# Patient Record
Sex: Female | Born: 1960 | Race: Black or African American | Hispanic: No | Marital: Single | Smoking: Never smoker
Health system: Southern US, Community
[De-identification: ages and names within clinical notes are randomized; demographics above are authoritative.]

## PROBLEM LIST (undated history)

## (undated) DIAGNOSIS — F4310 Post-traumatic stress disorder, unspecified: Secondary | ICD-10-CM

## (undated) DIAGNOSIS — G4730 Sleep apnea, unspecified: Secondary | ICD-10-CM

## (undated) DIAGNOSIS — A159 Respiratory tuberculosis unspecified: Secondary | ICD-10-CM

## (undated) DIAGNOSIS — N809 Endometriosis, unspecified: Secondary | ICD-10-CM

## (undated) DIAGNOSIS — I10 Essential (primary) hypertension: Secondary | ICD-10-CM

## (undated) DIAGNOSIS — R011 Cardiac murmur, unspecified: Secondary | ICD-10-CM

## (undated) DIAGNOSIS — R7303 Prediabetes: Secondary | ICD-10-CM

## (undated) DIAGNOSIS — N83209 Unspecified ovarian cyst, unspecified side: Secondary | ICD-10-CM

## (undated) DIAGNOSIS — R519 Headache, unspecified: Secondary | ICD-10-CM

## (undated) DIAGNOSIS — M1710 Unilateral primary osteoarthritis, unspecified knee: Secondary | ICD-10-CM

## (undated) DIAGNOSIS — D219 Benign neoplasm of connective and other soft tissue, unspecified: Secondary | ICD-10-CM

## (undated) HISTORY — PX: WISDOM TOOTH EXTRACTION: SHX21

## (undated) HISTORY — PX: DILATION AND CURETTAGE OF UTERUS: SHX78

---

## 2018-04-08 ENCOUNTER — Other Ambulatory Visit: Payer: Self-pay

## 2018-04-08 ENCOUNTER — Encounter (HOSPITAL_COMMUNITY): Payer: Self-pay | Admitting: Emergency Medicine

## 2018-04-08 ENCOUNTER — Ambulatory Visit (HOSPITAL_COMMUNITY)
Admission: EM | Admit: 2018-04-08 | Discharge: 2018-04-08 | Disposition: A | Discharge status: Home / Self Care | Payer: 59 | Attending: Emergency Medicine | Admitting: Emergency Medicine

## 2018-04-08 ENCOUNTER — Ambulatory Visit (INDEPENDENT_AMBULATORY_CARE_PROVIDER_SITE_OTHER): Payer: 59

## 2018-04-08 DIAGNOSIS — R05 Cough: Secondary | ICD-10-CM | POA: Diagnosis not present

## 2018-04-08 DIAGNOSIS — R69 Illness, unspecified: Secondary | ICD-10-CM | POA: Diagnosis not present

## 2018-04-08 DIAGNOSIS — R062 Wheezing: Secondary | ICD-10-CM

## 2018-04-08 DIAGNOSIS — M7918 Myalgia, other site: Secondary | ICD-10-CM

## 2018-04-08 DIAGNOSIS — R112 Nausea with vomiting, unspecified: Secondary | ICD-10-CM | POA: Diagnosis not present

## 2018-04-08 DIAGNOSIS — J111 Influenza due to unidentified influenza virus with other respiratory manifestations: Secondary | ICD-10-CM

## 2018-04-08 DIAGNOSIS — J029 Acute pharyngitis, unspecified: Secondary | ICD-10-CM

## 2018-04-08 HISTORY — DX: Endometriosis, unspecified: N80.9

## 2018-04-08 HISTORY — DX: Unilateral primary osteoarthritis, unspecified knee: M17.10

## 2018-04-08 HISTORY — DX: Post-traumatic stress disorder, unspecified: F43.10

## 2018-04-08 HISTORY — DX: Benign neoplasm of connective and other soft tissue, unspecified: D21.9

## 2018-04-08 HISTORY — DX: Unspecified ovarian cyst, unspecified side: N83.209

## 2018-04-08 LAB — POCT RAPID STREP A: STREPTOCOCCUS, GROUP A SCREEN (DIRECT): NEGATIVE

## 2018-04-08 MED ORDER — ACETAMINOPHEN 325 MG PO TABS
650.0000 mg | ORAL_TABLET | Freq: Once | ORAL | Status: AC
  Administered 2018-04-08: 650 mg via ORAL

## 2018-04-08 MED ORDER — ONDANSETRON 4 MG PO TBDP: 4.0000 mg | ORAL_TABLET | Freq: Three times a day (TID) | ORAL | 0 refills | Status: DC | PRN

## 2018-04-08 MED ORDER — IPRATROPIUM-ALBUTEROL 0.5-2.5 (3) MG/3ML IN SOLN
3.0000 mL | Freq: Once | RESPIRATORY_TRACT | Status: AC
  Administered 2018-04-08: 3 mL via RESPIRATORY_TRACT

## 2018-04-08 MED ORDER — IBUPROFEN 600 MG PO TABS: 600.0000 mg | ORAL_TABLET | Freq: Four times a day (QID) | ORAL | 0 refills | Status: DC | PRN

## 2018-04-08 MED ORDER — CETIRIZINE HCL 10 MG PO CAPS: 10.0000 mg | ORAL_CAPSULE | Freq: Every day | ORAL | 0 refills | Status: DC

## 2018-04-08 MED ORDER — BENZONATATE 200 MG PO CAPS: 200.0000 mg | ORAL_CAPSULE | Freq: Three times a day (TID) | ORAL | 0 refills | Status: AC | PRN

## 2018-04-08 MED ORDER — OSELTAMIVIR PHOSPHATE 75 MG PO CAPS: 75.0000 mg | ORAL_CAPSULE | Freq: Two times a day (BID) | ORAL | 0 refills | Status: AC

## 2018-04-08 MED ORDER — ACETAMINOPHEN 325 MG PO TABS
ORAL_TABLET | ORAL | Status: AC
  Filled 2018-04-08: qty 2

## 2018-04-08 MED ORDER — ALBUTEROL SULFATE HFA 108 (90 BASE) MCG/ACT IN AERS: 1.0000 | INHALATION_SPRAY | Freq: Four times a day (QID) | RESPIRATORY_TRACT | 0 refills | Status: AC | PRN

## 2018-04-08 MED ORDER — IPRATROPIUM-ALBUTEROL 0.5-2.5 (3) MG/3ML IN SOLN
RESPIRATORY_TRACT | Status: AC
  Filled 2018-04-08: qty 3

## 2018-04-08 NOTE — Discharge Instructions (Signed)
Strep test and xray negative for pneumonia You likely have the flu or a similar viral upper respiratory infection Tamiflu twice daily for 5 days Begin daily cetirizine to help with congestion and drainage Tessalon every 8 hours as needed for cough Alternate tylenol and ibuprofen for better control of fever, body aches, headache.  Push fluids  Please follow up if fever not breaking, developing increased shortness of breath, difficulty breathing

## 2018-04-08 NOTE — ED Triage Notes (Signed)
Pt here for body aches and a cough that started on Sunday.  Pt states she was being treated for an inner ear infection with meclizine and antibiotics, along with Zofran for nausea.  Pt states she has been taking them as prescribed.

## 2018-04-09 NOTE — ED Provider Notes (Signed)
[place]    CSN: [id number] Arrival date & time: 04/08/18  1048     History   Chief Complaint Chief Complaint  Patient presents with  . Generalized Body Aches  . Cough    HPI Teresa Mann is a 58 y.o. female no contributing past medical history presenting today for evaluation of cough, body aches, hot and cold chills, sore throat.  Symptoms began on Sunday, 2 days ago.  She was seen approximately 1 week ago elsewhere and was treated for an inner ear infection.  She is taking amoxicillin.  She is also been using Zofran and meclizine as needed for nausea and vomiting.  On Sunday she had worsening of her symptoms.  She has tried Coricidin for nasal congestion without relief.  She has had some slight shortness of breath.  She denies any diarrhea or changes in bowel movements.  Denies abdominal pain.  Patient works for the [place], travels from [place] where she lives to work here.  HPI  Past Medical History:  Diagnosis Date  . Arthritis of knee   . Endometriosis   . Fibroids   . Ovarian cyst   . PTSD (post-traumatic stress disorder)     There are no active problems to display for this patient.   History reviewed. No pertinent surgical history.  OB History   No obstetric history on file.      Home Medications    Prior to Admission medications   Medication Sig Start Date End Date Taking? Authorizing Provider  meclizine (ANTIVERT) 25 MG tablet  02/03/18  Yes [provider]  albuterol (PROVENTIL HFA;VENTOLIN HFA) 108 (90 Base) MCG/ACT inhaler Inhale 1-2 puffs into the lungs every 6 (six) hours as needed for wheezing or shortness of breath. 04/08/18   Sharnetta Gielow C, PA-C  benzonatate (TESSALON) 200 MG capsule Take 1 capsule (200 mg total) by mouth 3 (three) times daily as needed for up to 7 days for cough. 04/08/18 04/15/18  Shaneya Taketa C, PA-C  Cetirizine HCl 10 MG CAPS Take 1 capsule (10 mg total) by mouth daily for 10 days. 04/08/18 04/18/18   Caley Ciaramitaro C, PA-C  ibuprofen (ADVIL,MOTRIN) 600 MG tablet Take 1 tablet (600 mg total) by mouth every 6 (six) hours as needed. 04/08/18   Selassie Spatafore C, PA-C  ondansetron (ZOFRAN ODT) 4 MG disintegrating tablet Take 1 tablet (4 mg total) by mouth every 8 (eight) hours as needed for nausea or vomiting. 04/08/18   Caidon Foti C, PA-C  oseltamivir (TAMIFLU) 75 MG capsule Take 1 capsule (75 mg total) by mouth every 12 (twelve) hours for 5 days. 04/08/18 04/13/18  Chestine Belknap, Elesa Hacker, PA-C    Family History History reviewed. No pertinent family history.  Social History Social History   Tobacco Use  . Smoking status: Never Smoker  . Smokeless tobacco: Never Used  Substance Use Topics  . Alcohol use: Never    Frequency: Never  . Drug use: Never     Allergies   Patient has no known allergies.   Review of Systems Review of Systems  Constitutional: Positive for chills, fatigue and fever. Negative for activity change and appetite change.  HENT: Positive for congestion, rhinorrhea and sore throat. Negative for ear pain, sinus pressure and trouble swallowing.   Eyes: Negative for discharge and redness.  Respiratory: Positive for cough. Negative for chest tightness and shortness of breath.   Cardiovascular: Negative for chest pain.  Gastrointestinal: Positive for nausea. Negative for abdominal pain, diarrhea and  vomiting.  Musculoskeletal: Positive for myalgias.  Skin: Negative for rash.  Neurological: Positive for headaches. Negative for dizziness and light-headedness.     Physical Exam Triage Vital Signs ED Triage Vitals  Enc Vitals Group     BP 04/08/18 1155 (!) 142/89     Pulse Rate 04/08/18 1155 (!) 122     Resp 04/08/18 1155 (!) 32     Temp 04/08/18 1155 (!) 104.2 F (40.1 C)     Temp Source 04/08/18 1155 Temporal     SpO2 04/08/18 1155 96 %     Weight --      Height --      Head Circumference --      Peak Flow --      Pain Score 04/08/18 1158 9     Pain Loc --        Pain Edu? --      Excl. in [place]? --    No data found.  Updated Vital Signs BP (!) 142/89 (BP Location: Left Arm)   Pulse (!) 122   Temp (!) 104.2 F (40.1 C) (Temporal)   Resp (!) 32   SpO2 96%   Visual Acuity Right Eye Distance:   Left Eye Distance:   Bilateral Distance:    Right Eye Near:   Left Eye Near:    Bilateral Near:     Physical Exam Vitals signs and nursing note reviewed.  Constitutional:      General: She is not in acute distress.    Appearance: She is well-developed.     Comments: Appears slightly uncomfortable, but nontoxic appearing, no acute distress  HENT:     Head: Normocephalic and atraumatic.     Ears:     Comments: Bilateral ears without tenderness to palpation of external auricle, tragus and mastoid, EAC's without erythema or swelling, TM's with good bony landmarks and cone of light.  Left TM slightly erythematous to superior aspect of TM, right TM nonerythematous    Nose:     Comments: Rhinorrhea present bilaterally    Mouth/Throat:     Comments: Oral mucosa pink and moist, no tonsillar enlargement or exudate. Posterior pharynx patent and nonerythematous, no uvula deviation or swelling. Normal phonation. Eyes:     Conjunctiva/sclera: Conjunctivae normal.  Neck:     Musculoskeletal: Neck supple.  Cardiovascular:     Rate and Rhythm: Regular rhythm. Tachycardia present.     Heart sounds: No murmur.  Pulmonary:     Effort: Pulmonary effort is normal. No respiratory distress.     Breath sounds: Wheezing present.     Comments: Expiratory wheezing throughout bilateral upper lung fields auscultated anteriorly, lungs clear to auscultation of lung fields posteriorly Breathing comfortably at rest Abdominal:     Palpations: Abdomen is soft.     Tenderness: There is no abdominal tenderness.  Skin:    General: Skin is warm and dry.  Neurological:     Mental Status: She is alert.      UC Treatments / Results  Labs (all labs ordered are listed,  but only abnormal results are displayed) Labs Reviewed  CULTURE, GROUP A STREP [facility])  POCT RAPID STREP A    EKG None  Radiology Dg Chest 2 View  Result Date: 04/08/2018 CLINICAL DATA:  Cough. EXAM: CHEST - 2 VIEW COMPARISON:  No prior. FINDINGS: Mediastinum hilar structures normal. Heart size normal. Bilateral interstitial prominence noted. Findings suggest mild pneumonitis. Low lung volumes with mild basilar atelectasis. No pleural effusion pneumothorax. IMPRESSION:  Mild bilateral interstitial prominence suggesting pneumonitis. Low lung volumes with mild bibasilar atelectasis. Electronically Signed   By: Marcello Moores  Register   On: 04/08/2018 12:42    Procedures Procedures (including critical care time)  Medications Ordered in UC Medications  acetaminophen (TYLENOL) tablet 650 mg (650 mg Oral Given 04/08/18 1206)  ipratropium-albuterol (DUONEB) 0.5-2.5 (3) MG/3ML nebulizer solution 3 mL (3 mLs Nebulization Given 04/08/18 1236)    Initial Impression / Assessment and Plan / UC Course  I have reviewed the triage vital signs and the nursing notes.  Pertinent labs & imaging results that were available during my care of the patient were reviewed by me and considered in my medical decision making (see chart for details).  Clinical Course as of Apr 09 1050  Tue Apr 08, 2018  1245 Temperature rechecked 100.9 Heart rate still 120, right after breathing treatment   [HW]    Clinical Course User Index [HW] Waseem Suess C, PA-C    Chest x-ray negative for pneumonia, suggestive of pneumonitis.  Ear infection appears to be resolving.  Symptoms most likely influenza causing fever, tachycardia and constellation of symptoms.  Patient on edge of Tamiflu window, given history of hypertension will initiate on Tamiflu.  Will defer any steroids to help with wheezing in order to not suppress immune system.  Recommended Tessalon for cough, daily Zyrtec for congestion and drainage.  Tylenol and ibuprofen for  fever control.  Push fluids.  Rest.  Continue to monitor temperature, breathing, symptoms,Discussed strict return precautions. Patient verbalized understanding and is agreeable with plan.  Final Clinical Impressions(s) / UC Diagnoses   Final diagnoses:  Influenza-like illness     Discharge Instructions     Strep test and xray negative for pneumonia You likely have the flu or a similar viral upper respiratory infection Tamiflu twice daily for 5 days Begin daily cetirizine to help with congestion and drainage Tessalon every 8 hours as needed for cough Alternate tylenol and ibuprofen for better control of fever, body aches, headache.  Push fluids  Please follow up if fever not breaking, developing increased shortness of breath, difficulty breathing    ED Prescriptions    Medication Sig Dispense Auth. Provider   Cetirizine HCl 10 MG CAPS Take 1 capsule (10 mg total) by mouth daily for 10 days. 10 capsule Caswell Alvillar C, PA-C   benzonatate (TESSALON) 200 MG capsule Take 1 capsule (200 mg total) by mouth 3 (three) times daily as needed for up to 7 days for cough. 28 capsule Essance Gatti C, PA-C   ibuprofen (ADVIL,MOTRIN) 600 MG tablet Take 1 tablet (600 mg total) by mouth every 6 (six) hours as needed. 30 tablet Kali Ambler C, PA-C   oseltamivir (TAMIFLU) 75 MG capsule Take 1 capsule (75 mg total) by mouth every 12 (twelve) hours for 5 days. 10 capsule Shashana Fullington C, PA-C   ondansetron (ZOFRAN ODT) 4 MG disintegrating tablet Take 1 tablet (4 mg total) by mouth every 8 (eight) hours as needed for nausea or vomiting. 20 tablet [place]vada Seaborn C, PA-C   albuterol (PROVENTIL HFA;VENTOLIN HFA) 108 (90 Base) MCG/ACT inhaler Inhale 1-2 puffs into the lungs every 6 (six) hours as needed for wheezing or shortness of breath. 1 Inhaler Gertha Lichtenberg C, PA-C     Controlled Substance Prescriptions [place] Controlled Substance Registry consulted? Not Applicable   Janith Lima,  [place] 04/09/18 1057

## 2018-04-10 LAB — CULTURE, GROUP A STREP (THRC)

## 2018-05-26 ENCOUNTER — Emergency Department (HOSPITAL_COMMUNITY): Payer: 59

## 2018-05-26 ENCOUNTER — Emergency Department (HOSPITAL_COMMUNITY)
Admission: EM | Admit: 2018-05-26 | Discharge: 2018-05-26 | Disposition: A | Discharge status: Home / Self Care | Payer: 59 | Attending: Emergency Medicine | Admitting: Emergency Medicine

## 2018-05-26 ENCOUNTER — Encounter (HOSPITAL_COMMUNITY): Payer: Self-pay

## 2018-05-26 DIAGNOSIS — Z79899 Other long term (current) drug therapy: Secondary | ICD-10-CM | POA: Diagnosis not present

## 2018-05-26 DIAGNOSIS — R1032 Left lower quadrant pain: Secondary | ICD-10-CM | POA: Insufficient documentation

## 2018-05-26 DIAGNOSIS — N888 Other specified noninflammatory disorders of cervix uteri: Secondary | ICD-10-CM

## 2018-05-26 DIAGNOSIS — R9389 Abnormal findings on diagnostic imaging of other specified body structures: Secondary | ICD-10-CM

## 2018-05-26 DIAGNOSIS — R030 Elevated blood-pressure reading, without diagnosis of hypertension: Secondary | ICD-10-CM

## 2018-05-26 LAB — COMPREHENSIVE METABOLIC PANEL
ALT: 14 U/L (ref 0–44)
ANION GAP: 6 (ref 5–15)
AST: 17 U/L (ref 15–41)
Albumin: 3.5 g/dL (ref 3.5–5.0)
Alkaline Phosphatase: 93 U/L (ref 38–126)
BUN: 10 mg/dL (ref 6–20)
CO2: 27 mmol/L (ref 22–32)
Calcium: 8.8 mg/dL — ABNORMAL LOW (ref 8.9–10.3)
Chloride: 107 mmol/L (ref 98–111)
Creatinine, Ser: 0.88 mg/dL (ref 0.44–1.00)
GFR calc Af Amer: >60 mL/min (ref >60)
GFR calc non Af Amer: >60 mL/min (ref >60)
Glucose, Bld: 98 mg/dL (ref 70–99)
Potassium: 3.7 mmol/L (ref 3.5–5.1)
Sodium: 140 mmol/L (ref 135–145)
Total Bilirubin: 0.4 mg/dL (ref 0.3–1.2)
Total Protein: 6.8 g/dL (ref 6.5–8.1)

## 2018-05-26 LAB — URINALYSIS, ROUTINE W REFLEX MICROSCOPIC
Bilirubin Urine: NEGATIVE
Glucose, UA: NEGATIVE mg/dL
Hgb urine dipstick: NEGATIVE
Ketones, ur: NEGATIVE mg/dL
Leukocytes,Ua: NEGATIVE
Nitrite: NEGATIVE
Protein, ur: NEGATIVE mg/dL
Specific Gravity, Urine: 1.02 (ref 1.005–1.030)
pH: 6 (ref 5.0–8.0)

## 2018-05-26 LAB — CBC
HCT: 44.1 % (ref 36.0–46.0)
Hemoglobin: 13.7 g/dL (ref 12.0–15.0)
MCH: 26.5 pg (ref 26.0–34.0)
MCHC: 31.1 g/dL (ref 30.0–36.0)
MCV: 85.3 fL (ref 80.0–100.0)
Platelets: 199 10*3/uL (ref 150–400)
RBC: 5.17 MIL/uL — AB (ref 3.87–5.11)
RDW: 14.6 % (ref 11.5–15.5)
WBC: 5.7 10*3/uL (ref 4.0–10.5)
nRBC: 0 % (ref 0.0–0.2)

## 2018-05-26 LAB — LIPASE, BLOOD: Lipase: 34 U/L (ref 11–51)

## 2018-05-26 MED ORDER — MORPHINE SULFATE (PF) 4 MG/ML IV SOLN
4.0000 mg | Freq: Once | INTRAVENOUS | Status: AC
  Administered 2018-05-26: 4 mg via INTRAVENOUS
  Filled 2018-05-26: qty 1

## 2018-05-26 MED ORDER — MELOXICAM 15 MG PO TABS: 15.0000 mg | ORAL_TABLET | Freq: Every day | ORAL | 0 refills | Status: DC

## 2018-05-26 MED ORDER — SODIUM CHLORIDE 0.9 % IV BOLUS
1000.0000 mL | Freq: Once | INTRAVENOUS | Status: AC
  Administered 2018-05-26: 1000 mL via INTRAVENOUS

## 2018-05-26 MED ORDER — IOHEXOL 300 MG/ML  SOLN
100.0000 mL | Freq: Once | INTRAMUSCULAR | Status: AC | PRN
  Administered 2018-05-26: 100 mL via INTRAVENOUS

## 2018-05-26 MED ORDER — SODIUM CHLORIDE 0.9% FLUSH: 3.0000 mL | Freq: Once | INTRAVENOUS | Status: DC

## 2018-05-26 MED ORDER — ONDANSETRON HCL 4 MG/2ML IJ SOLN
4.0000 mg | Freq: Once | INTRAMUSCULAR | Status: AC
  Administered 2018-05-26: 4 mg via INTRAVENOUS
  Filled 2018-05-26: qty 2

## 2018-05-26 NOTE — ED Provider Notes (Signed)
[facility] EMERGENCY DEPARTMENT Provider Note   CSN: [id number] Arrival date & time: 05/26/18  1139    History   Chief Complaint Chief Complaint  Patient presents with  . Ovarian Cyst  . Emesis    HPI Mayana Irigoyen is a 58 y.o. female.     Jerene Yeager is a 58 y.o. female with a history of uterine fibroids, endometriosis, ovarian cyst, arthritis and PTSD, who presents to the emergency department today complaining of left lower quadrant pain.  She had some mild discomfort yesterday but pain became severe today and she has had 3 episodes of nonbloody emesis.  She reports pain is a constant ache in this region that radiates across the lower abdomen.  She denies upper abdominal pain.  She has not had any diarrhea, no melena or blood in the stools.  She reports some mild constipation occasionally.  She denies any associated fevers or chills.  No burning or pain with urination, no hematuria no flank pain.  She denies vaginal bleeding or discharge.  Reports she has been postmenopausal for 1 year.  She was diagnosed with a left ovarian cyst about 1 year ago in [place], unsure of the size, has not had any follow-up for repeat ultrasound since then.  Denies any abdominal surgeries or prior abdominal issues.  She has not taken anything for pain prior to arrival, no other aggravating or alleviating factors.     Past Medical History:  Diagnosis Date  . Arthritis of knee   . Endometriosis   . Fibroids   . Ovarian cyst   . PTSD (post-traumatic stress disorder)     There are no active problems to display for this patient.   History reviewed. No pertinent surgical history.   OB History   No obstetric history on file.      Home Medications    Prior to Admission medications   Medication Sig Start Date End Date Taking? Authorizing Provider  albuterol (PROVENTIL HFA;VENTOLIN HFA) 108 (90 Base) MCG/ACT inhaler Inhale 1-2 puffs into the lungs every 6 (six)  hours as needed for wheezing or shortness of breath. 04/08/18   Wieters, Hallie C, PA-C  Cetirizine HCl 10 MG CAPS Take 1 capsule (10 mg total) by mouth daily for 10 days. 04/08/18 04/18/18  Wieters, Hallie C, PA-C  ibuprofen (ADVIL,MOTRIN) 600 MG tablet Take 1 tablet (600 mg total) by mouth every 6 (six) hours as needed. 04/08/18   Wieters, Hallie C, PA-C  meclizine (ANTIVERT) 25 MG tablet  02/03/18   [provider]  ondansetron (ZOFRAN ODT) 4 MG disintegrating tablet Take 1 tablet (4 mg total) by mouth every 8 (eight) hours as needed for nausea or vomiting. 04/08/18   Wieters, Elesa Hacker, PA-C    Family History No family history on file.  Social History Social History   Tobacco Use  . Smoking status: Never Smoker  . Smokeless tobacco: Never Used  Substance Use Topics  . Alcohol use: Never    Frequency: Never  . Drug use: Never     Allergies   Patient has no known allergies.   Review of Systems Review of Systems  Constitutional: Negative for chills and fever.  HENT: Negative.   Respiratory: Negative for cough and shortness of breath.   Cardiovascular: Negative for chest pain.  Gastrointestinal: Positive for abdominal pain, constipation, nausea and vomiting. Negative for blood in stool and diarrhea.  Genitourinary: Positive for pelvic pain. Negative for dysuria, flank pain, frequency, vaginal bleeding and  vaginal discharge.  Musculoskeletal: Negative for arthralgias, back pain and myalgias.  Skin: Negative for color change and rash.  Neurological: Negative for dizziness, syncope and light-headedness.  All other systems reviewed and are negative.    Physical Exam Updated Vital Signs BP (!) 151/82 (BP Location: Right Arm)   Pulse 85   Temp 98.3 F (36.8 C) (Oral)   Resp 16   SpO2 97%   Physical Exam Vitals signs and nursing note reviewed.  Constitutional:      General: She is not in acute distress.    Appearance: Normal appearance. She is well-developed. She is  obese. She is not ill-appearing or diaphoretic.  HENT:     Head: Normocephalic and atraumatic.     Mouth/Throat:     Mouth: Mucous membranes are moist.     Pharynx: Oropharynx is clear.  Eyes:     General:        Right eye: No discharge.        Left eye: No discharge.     Pupils: Pupils are equal, round, and reactive to light.  Neck:     Musculoskeletal: Neck supple.  Cardiovascular:     Rate and Rhythm: Normal rate and regular rhythm.     Heart sounds: Normal heart sounds. No murmur. No friction rub. No gallop.   Pulmonary:     Effort: Pulmonary effort is normal. No respiratory distress.     Breath sounds: Normal breath sounds. No wheezing or rales.     Comments: Respirations equal and unlabored, patient able to speak in full sentences, lungs clear to auscultation bilaterally Abdominal:     General: Bowel sounds are normal. There is no distension.     Palpations: Abdomen is soft. There is no mass.     Tenderness: There is abdominal tenderness. There is no guarding.     Comments: Abdomen is soft, nondistended, bowel sounds present throughout, there is some mild generalized tenderness throughout the abdomen but pain is most notable across the lower abdomen, particularly in the left lower quadrants and suprapubic region, no palpable masses, no guarding or rigidity.  No CVA tenderness bilaterally.  Genitourinary:    Comments: Deferred by patient Musculoskeletal:        General: No deformity.  Skin:    General: Skin is warm and dry.     Capillary Refill: Capillary refill takes less than 2 seconds.  Neurological:     Mental Status: She is alert.     Coordination: Coordination normal.     Comments: Speech is clear, able to follow commands Moves extremities without ataxia, coordination intact  Psychiatric:        Mood and Affect: Mood normal.        Behavior: Behavior normal.      ED Treatments / Results  Labs (all labs ordered are listed, but only abnormal results are  displayed) Labs Reviewed  COMPREHENSIVE METABOLIC PANEL - Abnormal; Notable for the following components:      Result Value   Calcium 8.8 (*)    All other components within normal limits  CBC - Abnormal; Notable for the following components:   RBC 5.17 (*)    All other components within normal limits  LIPASE, BLOOD  URINALYSIS, ROUTINE W REFLEX MICROSCOPIC    EKG None  Radiology No results found.  Procedures Procedures (including critical care time)  Medications Ordered in ED Medications  sodium chloride flush (NS) 0.9 % injection 3 mL (has no administration in time range)  sodium  chloride 0.9 % bolus 1,000 mL (has no administration in time range)  ondansetron (ZOFRAN) injection 4 mg (has no administration in time range)  morphine 4 MG/ML injection 4 mg (has no administration in time range)     Initial Impression / Assessment and Plan / ED Course  I have reviewed the triage vital signs and the nursing notes.  Pertinent labs & imaging results that were available during my care of the patient were reviewed by me and considered in my medical decision making (see chart for details).  Patient presents for evaluation of left lower quadrant abdominal pain with associated nausea and vomiting.  Symptoms started initially last night but got severely worse today.  Patient reports that a year ago she was diagnosed with a left ovarian cyst has not followed up at all regarding this, has some intermittent pain occasionally which she attributes to her cyst but is never had pain this severe.  Associated vomiting, no diarrhea has had some mild constipation, no fevers or chills.  No urinary symptoms.  No vaginal bleeding or discharge.  Patient is postmenopausal with last menstrual cycle 1 year ago.  On assessment patient appears uncomfortable she has tenderness across the lower abdomen with some mild upper abdominal tenderness as well, tenderness is most notable in the suprapubic and left lower  quadrants.  Given abdominal exam and history, basic abdominal labs were obtained from triage, but will proceed with both a CT abdomen pelvis and a pelvic ultrasound.  Given patient is postmenopausal, I feel that a change in her ovarian cyst is less likely, she also has a history of endometriosis and fibroids which should be relatively stable in the postmenopausal state, will also check CT to evaluate for diverticulitis, colitis or other intra-abdominal process as cause for patient's pain.  Pain medication given for symptomatic management.  Labs overall reassuring, no leukocytosis, normal hemoglobin, no acute electrolyte derangements, normal renal and liver function and normal lipase.  Urinalysis pending.  At shift change CT and pelvic ultrasound are pending, care signed out to PA Margarita Mail who will follow-up on imaging results and disposition appropriately.  Final Clinical Impressions(s) / ED Diagnoses   Final diagnoses:  LLQ Abdominal Pain    ED Discharge Orders    None       Janet Berlin 05/29/18 Ann Lions, MD 05/31/18 [phone]

## 2018-05-26 NOTE — Discharge Instructions (Signed)

## 2018-05-26 NOTE — ED Provider Notes (Signed)
3:14 PM BP (!) 172/99   Pulse 85   Temp 98.3 F (36.8 C) (Oral)   Resp 16   SpO2 97%  Patient with LLQ addominal pain Menopausal + constipation Hx of endometriosis and left ovarian cyst. Awaiting CT abd/pelvis  Patient pelvic ultrasound and CT abdomen pelvis concern for abnormal endometrial thickening and cervical mass.  I discussed the case with our on-call OB/GYN from faculty practice who recommended the patient follow very closely in the women's outpatient clinic.  I have discussed the findings with the patient who agrees and understands need for asked Barn[facility] Glasgow close follow-up.  Patient was given digital copy of her CT scan.  Her pain is improved after treatment here in the emergency department.  She appears appropriate for discharge at this time   Storm, [place], Hershal Coria 05/26/18 2348    Sherwood Gambler, MD 05/30/18 [phone]

## 2018-05-26 NOTE — ED Triage Notes (Signed)
Pt presents for evaluation of N/V with LLQ pain starting today. Pt has dx ovarian cyst to L side.

## 2018-05-30 ENCOUNTER — Other Ambulatory Visit: Payer: Self-pay

## 2018-05-30 ENCOUNTER — Emergency Department (HOSPITAL_COMMUNITY)
Admission: EM | Admit: 2018-05-30 | Discharge: 2018-05-31 | Disposition: A | Discharge status: Home / Self Care | Payer: 59 | Attending: Emergency Medicine | Admitting: Emergency Medicine

## 2018-05-30 DIAGNOSIS — N889 Noninflammatory disorder of cervix uteri, unspecified: Secondary | ICD-10-CM | POA: Diagnosis not present

## 2018-05-30 DIAGNOSIS — N888 Other specified noninflammatory disorders of cervix uteri: Secondary | ICD-10-CM

## 2018-05-30 DIAGNOSIS — N809 Endometriosis, unspecified: Secondary | ICD-10-CM | POA: Insufficient documentation

## 2018-05-30 DIAGNOSIS — N938 Other specified abnormal uterine and vaginal bleeding: Secondary | ICD-10-CM | POA: Diagnosis not present

## 2018-05-30 DIAGNOSIS — N939 Abnormal uterine and vaginal bleeding, unspecified: Secondary | ICD-10-CM | POA: Diagnosis present

## 2018-05-30 DIAGNOSIS — Z79899 Other long term (current) drug therapy: Secondary | ICD-10-CM | POA: Insufficient documentation

## 2018-05-30 LAB — URINALYSIS, ROUTINE W REFLEX MICROSCOPIC
Bilirubin Urine: NEGATIVE
Glucose, UA: NEGATIVE mg/dL
KETONES UR: NEGATIVE mg/dL
Leukocytes,Ua: NEGATIVE
Nitrite: NEGATIVE
Protein, ur: 30 mg/dL — AB
RBC / HPF: >50 RBC/hpf — ABNORMAL HIGH (ref 0–5)
Specific Gravity, Urine: 1.015 (ref 1.005–1.030)
pH: 7 (ref 5.0–8.0)

## 2018-05-30 LAB — CBC
HCT: 42.6 % (ref 36.0–46.0)
Hemoglobin: 12.9 g/dL (ref 12.0–15.0)
MCH: 26 pg (ref 26.0–34.0)
MCHC: 30.3 g/dL (ref 30.0–36.0)
MCV: 85.9 fL (ref 80.0–100.0)
Platelets: 186 10*3/uL (ref 150–400)
RBC: 4.96 MIL/uL (ref 3.87–5.11)
RDW: 14.7 % (ref 11.5–15.5)
WBC: 6.6 10*3/uL (ref 4.0–10.5)
nRBC: 0 % (ref 0.0–0.2)

## 2018-05-30 LAB — BASIC METABOLIC PANEL
ANION GAP: 7 (ref 5–15)
BUN: 12 mg/dL (ref 6–20)
CO2: 24 mmol/L (ref 22–32)
Calcium: 8.9 mg/dL (ref 8.9–10.3)
Chloride: 108 mmol/L (ref 98–111)
Creatinine, Ser: 0.76 mg/dL (ref 0.44–1.00)
GFR calc Af Amer: >60 mL/min (ref >60)
GFR calc non Af Amer: >60 mL/min (ref >60)
Glucose, Bld: 83 mg/dL (ref 70–99)
Potassium: 3.4 mmol/L — ABNORMAL LOW (ref 3.5–5.1)
Sodium: 139 mmol/L (ref 135–145)

## 2018-05-30 LAB — WET PREP, GENITAL
Clue Cells Wet Prep HPF POC: NONE SEEN
Sperm: NONE SEEN
Trich, Wet Prep: NONE SEEN

## 2018-05-30 MED ORDER — OXYCODONE HCL 5 MG PO TABS: 5.0000 mg | ORAL_TABLET | Freq: Four times a day (QID) | ORAL | 0 refills | Status: DC | PRN

## 2018-05-30 MED ORDER — FLUCONAZOLE 150 MG PO TABS
150.0000 mg | ORAL_TABLET | Freq: Once | ORAL | Status: AC
  Administered 2018-05-31: 150 mg via ORAL
  Filled 2018-05-30: qty 1

## 2018-05-30 MED ORDER — OXYCODONE-ACETAMINOPHEN 5-325 MG PO TABS
1.0000 | ORAL_TABLET | Freq: Once | ORAL | Status: AC
  Administered 2018-05-30: 1 via ORAL
  Filled 2018-05-30: qty 1

## 2018-05-30 NOTE — ED Triage Notes (Signed)
Pt reports vaginal bleeding x 1 day. Pt reports passing large clots. Pt reports feeling weak, tired, and dizzy. Pt reports being seen here for pain on Monday, and has appointment at [facility] clinic on Wednesday. Pt reports LLQ pain. Pt reports nausea, no vomiting.

## 2018-05-30 NOTE — Discharge Instructions (Signed)
Your abnormal vaginal bleeding is concerning for potential cancer.  Please call and follow up closely with gynecologist recently referred to you during the last visit.  You have evidence of yeast infection on today's exam, which was treated with diflucan given in the ER.  Take pain medication as needed but avoid driving or operate heavy machinery as it causes drowsiness.  Return if you have any concerns.

## 2018-05-30 NOTE — ED Provider Notes (Signed)
[facility] EMERGENCY DEPARTMENT Provider Note   CSN: [id number] Arrival date & time: 05/30/18  1703    History   Chief Complaint Chief Complaint  Patient presents with  . Vaginal Bleeding    HPI Torre Pikus is a 58 y.o. female.     The history is provided by the patient and medical records. No language interpreter was used.  Vaginal Bleeding     58 year old female presenting for vaginal bleeding.  Patient prior history of endometriosis, ovarian cyst, fibroid recently seen in ED 4 days ago for lower abdominal pain.  Work-up with a pelvic ultrasound and CT of the abdomen pelvis showing concerns of abnormal endometrial thickening and cervical mass.  Was scheduled to follow-up closely with [facility] outpatient clinic. Throughout the day today she report vaginal bleeding passing large blood clots.  She endorsed pain to her lower abdomen.  Pain is sharp, throbbing, 4 out of 10, nonradiating.  No report of vaginal discharge or dysuria.  She does endorse some generalized fatigue and lightheadedness.  No nausea vomiting or diarrhea.  She is currently sexually active.  Her postmenopausal started approximately a year ago.  Her menarche started at the age of 30.  She denies tobacco or alcohol abuse and denies any history of active cancer.  She was told that she has some thickening of endometriosis and is scheduled to follow-up with OB/GYN next week.  Past Medical History:  Diagnosis Date  . Arthritis of knee   . Endometriosis   . Fibroids   . Ovarian cyst   . PTSD (post-traumatic stress disorder)     There are no active problems to display for this patient.   No past surgical history on file.   OB History   No obstetric history on file.      Home Medications    Prior to Admission medications   Medication Sig Start Date End Date Taking? Authorizing Provider  acetaminophen (TYLENOL) 500 MG tablet Take 1,000 mg by mouth every 6 (six) hours as needed for mild  pain.    [provider]  albuterol (PROVENTIL HFA;VENTOLIN HFA) 108 (90 Base) MCG/ACT inhaler Inhale 1-2 puffs into the lungs every 6 (six) hours as needed for wheezing or shortness of breath. 04/08/18   Wieters, Hallie C, PA-C  ibuprofen (ADVIL,MOTRIN) 600 MG tablet Take 1 tablet (600 mg total) by mouth every 6 (six) hours as needed. 04/08/18   Wieters, Hallie C, PA-C  meclizine (ANTIVERT) 25 MG tablet Take 25 mg by mouth daily as needed for dizziness or nausea.  02/03/18   [provider]  meloxicam (MOBIC) 15 MG tablet Take 1 tablet (15 mg total) by mouth daily. 05/26/18   Biber, Abigail, PA-C  ondansetron (ZOFRAN ODT) 4 MG disintegrating tablet Take 1 tablet (4 mg total) by mouth every 8 (eight) hours as needed for nausea or vomiting. 04/08/18   Wieters, Elesa Hacker, PA-C    Family History No family history on file.  Social History Social History   Tobacco Use  . Smoking status: Never Smoker  . Smokeless tobacco: Never Used  Substance Use Topics  . Alcohol use: Never    Frequency: Never  . Drug use: Never     Allergies   Patient has no known allergies.   Review of Systems Review of Systems  Genitourinary: Positive for vaginal bleeding.  All other systems reviewed and are negative.    Physical Exam Updated Vital Signs BP (!) 165/99 (BP Location: Right Arm)  Pulse 72   Temp 98.1 F (36.7 C) (Oral)   Resp 20   Ht 5\' 3"  (1.6 m)   Wt 116.1 kg   SpO2 100%   BMI 45.35 kg/m   Physical Exam Vitals signs and nursing note reviewed.  Constitutional:      General: She is not in acute distress.    Appearance: She is well-developed.  HENT:     Head: Atraumatic.  Eyes:     Conjunctiva/sclera: Conjunctivae normal.  Neck:     Musculoskeletal: Neck supple.  Cardiovascular:     Rate and Rhythm: Normal rate and regular rhythm.     Pulses: Normal pulses.     Heart sounds: Normal heart sounds.  Pulmonary:     Effort: Pulmonary effort is normal.     Breath  sounds: Normal breath sounds.  Abdominal:     Palpations: Abdomen is soft.     Tenderness: There is abdominal tenderness (Mild tenderness to her left pelvic region without guarding or rebound tenderness.  No obvious mass appreciated.).  Genitourinary:    Comments: Chaperone present during exam.  No inguinal lymphadenopathy or hernia noted.  Normal external genitalia.  Mild discomfort with speculum insertion.  Moderate amount of blood noted in vaginal vault obscuring view.  No obvious masses visualized no obvious vaginal discharge.  No tenderness on bimanual examination. Skin:    Findings: No rash.  Neurological:     Mental Status: She is alert.      ED Treatments / Results  Labs (all labs ordered are listed, but only abnormal results are displayed) Labs Reviewed  WET PREP, GENITAL - Abnormal; Notable for the following components:      Result Value   Yeast Wet Prep HPF POC PRESENT (*)    WBC, Wet Prep HPF POC MANY (*)    All other components within normal limits  BASIC METABOLIC PANEL - Abnormal; Notable for the following components:   Potassium 3.4 (*)    All other components within normal limits  URINALYSIS, ROUTINE W REFLEX MICROSCOPIC - Abnormal; Notable for the following components:   Color, Urine AMBER (*)    APPearance HAZY (*)    Hgb urine dipstick LARGE (*)    Protein, ur 30 (*)    RBC / HPF >50 (*)    Bacteria, UA RARE (*)    All other components within normal limits  CBC  RPR  HIV ANTIBODY (ROUTINE TESTING W REFLEX)  GC/CHLAMYDIA PROBE AMP ([place]) NOT AT [facility]    EKG None  Radiology No results found.  Procedures Procedures (including critical care time)  Medications Ordered in ED Medications  fluconazole (DIFLUCAN) tablet 150 mg (has no administration in time range)  oxyCODONE-acetaminophen (PERCOCET/ROXICET) 5-325 MG per tablet 1 tablet (1 tablet Oral Given 05/30/18 2302)     Initial Impression / Assessment and Plan / ED Course  I have reviewed  the triage vital signs and the nursing notes.  Pertinent labs & imaging results that were available during my care of the patient were reviewed by me and considered in my medical decision making (see chart for details).        BP (!) 159/88 (BP Location: Right Arm)   Pulse 67   Temp 98.3 F (36.8 C) (Oral)   Resp 18   Ht 5\' 3"  (1.6 m)   Wt 116.1 kg   SpO2 98%   BMI 45.35 kg/m    Final Clinical Impressions(s) / ED Diagnoses   Final diagnoses:  Dysfunctional uterine bleeding  Cervical mass    ED Discharge Orders         Ordered    oxyCODONE (ROXICODONE) 5 MG immediate release tablet  Every 6 hours PRN     05/30/18 2338         8:30 PM This is a postmenopausal female presenting with acute onset of vaginal bleeding and pelvic pain.  She was seen several days prior for lower abdominal pain and had pelvic ultrasound as well as abdominal and pelvis CT scan which shows endometrial thickening and cervical mass.  She is scheduled to follow-up with GYN next week.  I suspect her vaginal bleeding is likely secondary to malignancy.  However she mentioned that she is sexually active therefore will perform pelvic examination to rule out infectious cause.  11:35 PM Wet prep shows evidence of yeast and many WBC.  I have low suspicion for PID.  Diflucan 100 mg given for yeast.  UA without signs of urinary tract infection.  Labs are reassuring.  Will prescribe pain medication for her pain.  She will f/u closely with OBGYN for outpt work up which will include biopsy.  Return precaution given. Pt is hemodynamically stable.  No anemia.   Domenic Moras, PA-C 05/30/18 2341    Hayden Rasmussen, MD 05/31/18 [phone]

## 2018-05-31 LAB — HIV ANTIBODY (ROUTINE TESTING W REFLEX): HIV Screen 4th Generation wRfx: NONREACTIVE

## 2018-05-31 LAB — RPR: RPR Ser Ql: NONREACTIVE

## 2018-06-02 LAB — GC/CHLAMYDIA PROBE AMP (~~LOC~~) NOT AT ARMC
Chlamydia: NEGATIVE
Neisseria Gonorrhea: NEGATIVE

## 2018-06-03 ENCOUNTER — Telehealth: Payer: Self-pay

## 2018-06-03 NOTE — Telephone Encounter (Signed)
Pt called wanting to know what she had to do to prepare for appt tomorrow.  Unable to LM due to VM box being full.

## 2018-06-04 ENCOUNTER — Other Ambulatory Visit (HOSPITAL_COMMUNITY)
Admission: RE | Admit: 2018-06-04 | Discharge: 2018-06-04 | Disposition: A | Discharge status: Home / Self Care | Payer: 59 | Source: Ambulatory Visit | Attending: Obstetrics & Gynecology | Admitting: Obstetrics & Gynecology

## 2018-06-04 ENCOUNTER — Ambulatory Visit: Payer: 59 | Admitting: Obstetrics & Gynecology

## 2018-06-04 ENCOUNTER — Ambulatory Visit: Payer: 59 | Admitting: Clinical

## 2018-06-04 ENCOUNTER — Encounter: Payer: Self-pay | Admitting: Obstetrics & Gynecology

## 2018-06-04 VITALS — BP 147/103 | HR 78 | Wt 253.5 lb

## 2018-06-04 DIAGNOSIS — L83 Acanthosis nigricans: Secondary | ICD-10-CM | POA: Diagnosis not present

## 2018-06-04 DIAGNOSIS — R9389 Abnormal findings on diagnostic imaging of other specified body structures: Secondary | ICD-10-CM

## 2018-06-04 DIAGNOSIS — D219 Benign neoplasm of connective and other soft tissue, unspecified: Secondary | ICD-10-CM | POA: Diagnosis not present

## 2018-06-04 DIAGNOSIS — Z23 Encounter for immunization: Secondary | ICD-10-CM

## 2018-06-04 DIAGNOSIS — F439 Reaction to severe stress, unspecified: Secondary | ICD-10-CM

## 2018-06-04 DIAGNOSIS — Z8659 Personal history of other mental and behavioral disorders: Secondary | ICD-10-CM

## 2018-06-04 MED ORDER — MISOPROSTOL 200 MCG PO TABS: ORAL_TABLET | ORAL | 0 refills | Status: DC

## 2018-06-04 NOTE — BH Specialist Note (Signed)
Integrated Behavioral Health Initial Visit  MRN: [id number] Name: Teresa Mann  Number of [facility] Clinician visits:: 1/6 Session Start time: 11:34  Session End time: 11:49 Total time: 15 minutes  Type of Service: [place] Interpretor:No. Interpretor Name and Language: n/a   Warm Hand Off Completed.       SUBJECTIVE: Tameya Kuznia is a 58 y.o. female accompanied by n/a Patient was referred by Clovia Cuff, MD for positive depression screen. Patient reports the following symptoms/concerns: Pt states her primary concern today is an increase in depression, anxiety, and stress related to her job;  pt is seeing a therapist at the [place] for PTSD.  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  Duration of problem: Increase ; Severity of problem: moderately severe  OBJECTIVE: Mood: Normal and Affect: Appropriate Risk of harm to self or others: No plan to harm self or others  LIFE CONTEXT: Family and Social: Pt lives in [place]; has adult children and 13 grandchildren School/Work: Pt works full-time, providing services to veterans Self-Care: Pt recognizes a greater need for self-care; is attending therapy weekly Life Changes: Move to [place] from [facility] in the past year, with job position change  GOALS ADDRESSED: Patient will: 1. Reduce symptoms of: anxiety, depression and stress 2. Increase knowledge and/or ability of: stress reduction  3. Demonstrate ability to: Increase motivation to adhere to plan of care  INTERVENTIONS: Interventions utilized: Solution-Focused Strategies and Psychoeducation and/or Health Education  Standardized Assessments completed: GAD-7 and PHQ 9  ASSESSMENT: Patient currently experiencing History of posttraumatic stress disorder   Patient may benefit from psychoeducation and brief therapeutic interventions  regarding coping with symptoms of depression and anxiety .  PLAN: 1. Follow up with behavioral health clinician on : As needed 2. Behavioral recommendations:  -Follow safety plan -Read educational materials regarding coping with symptoms of depression and anxiety -Consider sharing community resources and educational materials, as appropriate, with clients 3. Referral(s): [place] (In Clinic) 4. "From scale of 1-10, how likely are you to follow plan?": 10  Garlan Fair, LCSW  Depression screen [facility] 2/9 06/04/2018  Decreased Interest 3  Down, Depressed, Hopeless 3  PHQ - 2 Score 6  Altered sleeping 3  Tired, decreased energy 2  Change in appetite 2  Feeling bad or failure about yourself  3  Trouble concentrating 2  Moving slowly or fidgety/restless 0  Suicidal thoughts 2  PHQ-9 Score 20   GAD 7 : Generalized Anxiety Score 06/04/2018  Nervous, Anxious, on Edge 3  Control/stop worrying 3  Worry too much - different things 3  Trouble relaxing 3  Restless 2  Easily annoyed or irritable 2  Afraid - awful might happen 3  Total GAD 7 Score 19

## 2018-06-04 NOTE — Progress Notes (Signed)
   Subjective:    Patient ID: Teresa Mann, female    DOB: December 28, 1960, 58 y.o.   MRN: [id number]  HPI 58 yo divorced P5 (108, 32, 6, 40, and 31 kids, 20 grands) here today as a referral because abnormal CT and u/s findings done during a work for pelvic pain recently.   Review of Systems She has been abstinent for about 2 weeks, broke up with this person.    Objective:   Physical Exam Breathing, conversing, and ambulating normally Well nourished, well hydrated Black female, no apparent distress Abd- benign, obese  UPT negative, consent signed, time out done Cervix prepped with betadine and grasped with a single tooth tenaculum Uterus sounded to 9 cm Pipelle used for 5 passes with a scant amount of tissue obtained. There was a lot of old blood.  She tolerated the procedure well.     Assessment & Plan:  Endometrial thicken in a post menopausal woman- await pathology. I suspect that most of my sample was merely old blood. If sample is not adequate, then I would rec d&c, hysteroscopy Acanthosis nigricans- check HBA1C Stress- see Roselyn Reef TDAP today Flu vaccine today

## 2018-06-05 LAB — HEMOGLOBIN A1C
Est. average glucose Bld gHb Est-mCnc: 120 mg/dL
HEMOGLOBIN A1C: 5.8 % — AB (ref 4.8–5.6)

## 2018-06-11 ENCOUNTER — Telehealth: Payer: Self-pay

## 2018-06-11 NOTE — Telephone Encounter (Addendum)
-----   Message from Woodroe Mode, MD sent at 06/11/2018  8:50 AM EDT ----- Benign endometrial biopsy  Attempted to contact pt unable to LM due to VM box not set up.

## 2018-06-13 NOTE — Telephone Encounter (Signed)
Notified pt that her endo bx results are normal. Pt stated thank you with no further questions.

## 2018-06-27 ENCOUNTER — Encounter: Payer: Self-pay | Admitting: *Deleted

## 2018-11-06 ENCOUNTER — Encounter (HOSPITAL_COMMUNITY): Payer: Self-pay | Admitting: Emergency Medicine

## 2018-11-06 ENCOUNTER — Ambulatory Visit (HOSPITAL_COMMUNITY)
Admission: EM | Admit: 2018-11-06 | Discharge: 2018-11-06 | Disposition: A | Discharge status: Home / Self Care | Payer: 59 | Attending: Family Medicine | Admitting: Family Medicine

## 2018-11-06 ENCOUNTER — Other Ambulatory Visit: Payer: Self-pay

## 2018-11-06 DIAGNOSIS — Z20828 Contact with and (suspected) exposure to other viral communicable diseases: Secondary | ICD-10-CM | POA: Diagnosis not present

## 2018-11-06 DIAGNOSIS — R0981 Nasal congestion: Secondary | ICD-10-CM | POA: Diagnosis not present

## 2018-11-06 DIAGNOSIS — J309 Allergic rhinitis, unspecified: Secondary | ICD-10-CM | POA: Diagnosis not present

## 2018-11-06 DIAGNOSIS — N83209 Unspecified ovarian cyst, unspecified side: Secondary | ICD-10-CM | POA: Insufficient documentation

## 2018-11-06 DIAGNOSIS — J3489 Other specified disorders of nose and nasal sinuses: Secondary | ICD-10-CM | POA: Insufficient documentation

## 2018-11-06 DIAGNOSIS — R05 Cough: Secondary | ICD-10-CM | POA: Diagnosis not present

## 2018-11-06 DIAGNOSIS — R067 Sneezing: Secondary | ICD-10-CM | POA: Insufficient documentation

## 2018-11-06 DIAGNOSIS — R0989 Other specified symptoms and signs involving the circulatory and respiratory systems: Secondary | ICD-10-CM | POA: Insufficient documentation

## 2018-11-06 NOTE — ED Provider Notes (Signed)
[facility]    CSN: [id number] Arrival date & time: 11/06/18  1135     History   Chief Complaint Chief Complaint  Patient presents with  . Cough    HPI Teresa Mann is a 58 y.o. female.   Pt is a 58 year old female that presents with nasal congestion, rhinorrhea, scratchy throat, mild cough, sneezing.  This is been present over the past few days.  She drank some hot tea and ibuprofen last night.  History of occasional seasonal allergies.  Reporting that her work was concerned due to her coughing and sneezing. Denies any fever, SOB.  ROS per HPI      Past Medical History:  Diagnosis Date  . Arthritis of knee   . Endometriosis   . Fibroids   . Ovarian cyst   . PTSD (post-traumatic stress disorder)     Patient Active Problem List   Diagnosis Date Noted  . Thickened endometrium 06/04/2018  . Fibroid 06/04/2018    History reviewed. No pertinent surgical history.  OB History   No obstetric history on file.      Home Medications    Prior to Admission medications   Medication Sig Start Date End Date Taking? Authorizing Provider  albuterol (PROVENTIL HFA;VENTOLIN HFA) 108 (90 Base) MCG/ACT inhaler Inhale 1-2 puffs into the lungs every 6 (six) hours as needed for wheezing or shortness of breath. 04/08/18  Yes Wieters, Hallie C, PA-C  acetaminophen (TYLENOL) 500 MG tablet Take 1,000 mg by mouth every 6 (six) hours as needed for mild pain.    [provider]  ibuprofen (ADVIL,MOTRIN) 600 MG tablet Take 1 tablet (600 mg total) by mouth every 6 (six) hours as needed. 04/08/18   Wieters, Hallie C, PA-C  misoprostol (CYTOTEC) 200 MCG tablet Take 3 pills by mouth the night before biopsy. Patient not taking: Reported on 06/04/2018 06/04/18 11/06/18  Emily Filbert, MD    Family History No family history on file.  Social History Social History   Tobacco Use  . Smoking status: Never Smoker  . Smokeless tobacco: Never Used  Substance Use Topics  . Alcohol  use: Never    Frequency: Never  . Drug use: Never     Allergies   Patient has no known allergies.   Review of Systems Review of Systems   Physical Exam Triage Vital Signs ED Triage Vitals  Enc Vitals Group     BP 11/06/18 1203 (!) 156/88     Pulse Rate 11/06/18 1203 79     Resp 11/06/18 1203 18     Temp 11/06/18 1203 98.4 F (36.9 C)     Temp Source 11/06/18 1203 Oral     SpO2 11/06/18 1203 97 %     Weight --      Height --      Head Circumference --      Peak Flow --      Pain Score 11/06/18 1202 3     Pain Loc --      Pain Edu? --      Excl. in [place]? --    No data found.  Updated Vital Signs BP (!) 156/88   Pulse 79   Temp 98.4 F (36.9 C) (Oral)   Resp 18   SpO2 97%   Visual Acuity Right Eye Distance:   Left Eye Distance:   Bilateral Distance:    Right Eye Near:   Left Eye Near:    Bilateral Near:  Physical Exam Vitals signs and nursing note reviewed.  Constitutional:      General: She is not in acute distress.    Appearance: Normal appearance. She is not ill-appearing, toxic-appearing or diaphoretic.  HENT:     Head: Normocephalic and atraumatic.     Right Ear: Tympanic membrane and ear canal normal.     Left Ear: Tympanic membrane and ear canal normal.     Nose: Congestion and rhinorrhea present.     Mouth/Throat:     Pharynx: Oropharynx is clear.  Eyes:     Conjunctiva/sclera: Conjunctivae normal.  Neck:     Musculoskeletal: Normal range of motion.  Pulmonary:     Effort: Pulmonary effort is normal.     Breath sounds: Normal breath sounds.  Musculoskeletal: Normal range of motion.  Skin:    General: Skin is warm and dry.  Neurological:     Mental Status: She is alert.  Psychiatric:        Mood and Affect: Mood normal.      UC Treatments / Results  Labs (all labs ordered are listed, but only abnormal results are displayed) Labs Reviewed  NOVEL CORONAVIRUS, NAA (HOSPITAL ORDER, SEND-OUT TO REF LAB)    EKG   Radiology  No results found.  Procedures Procedures (including critical care time)  Medications Ordered in UC Medications - No data to display  Initial Impression / Assessment and Plan / UC Course  I have reviewed the triage vital signs and the nursing notes.  Pertinent labs & imaging results that were available during my care of the patient were reviewed by me and considered in my medical decision making (see chart for details).     Allergic rhinitis- treating with zyrtec and Flonase COVID testing done for precautionary. Not convinced of this.   Final Clinical Impressions(s) / UC Diagnoses   Final diagnoses:  Allergic rhinitis, unspecified seasonality, unspecified trigger     Discharge Instructions     I believe that your symptoms are allergy related. Flonase and Zyrtec for symptoms. If for any reason your symptoms worsen please let us know and we can send you for COVID testing at that time.   I am not convinced this is COVID Follow up as needed for continued or worsening symptoms     ED Prescriptions    None     Controlled Substance Prescriptions [place] Controlled Substance Registry consulted? Not Applicable   Orvan July, NP 11/07/18 0830

## 2018-11-06 NOTE — Discharge Instructions (Addendum)
I believe that your symptoms are allergy related. Flonase and Zyrtec for symptoms. If for any reason your symptoms worsen please let us know and we can send you for COVID testing at that time.   I am not convinced this is COVID Follow up as needed for continued or worsening symptoms

## 2018-11-06 NOTE — ED Triage Notes (Signed)
PT developed a cough and sneezing yesterday. No other sypmtoms reported.

## 2018-11-07 LAB — NOVEL CORONAVIRUS, NAA (HOSP ORDER, SEND-OUT TO REF LAB; TAT 18-24 HRS): SARS-CoV-2, NAA: NOT DETECTED

## 2019-04-19 IMAGING — CT CT ABD-PELV W/ CM
2 of 5 series · 16 of 46 positions shown, 18 images · IV contrast (omnipaque)
Comparison: Pelvic ultrasound 05/26/2018

CLINICAL DATA: Nausea, vomiting, left lower quadrant pain

EXAM:
CT ABDOMEN AND PELVIS WITH CONTRAST
TECHNIQUE: Multidetector CT imaging of the abdomen and pelvis was performed
using the standard protocol following bolus administration of
intravenous contrast.
CONTRAST:  100mL OMNIPAQUE IOHEXOL 300 MG/ML  SOLN

[Series 3: abd/ pelvis 5.0 i30f 2 · axial · 0.98mm/px · z∈[+1110,+1514]mm · 13 of 91 slices shown, 15 images]
[im 5/91  soft-tissue]
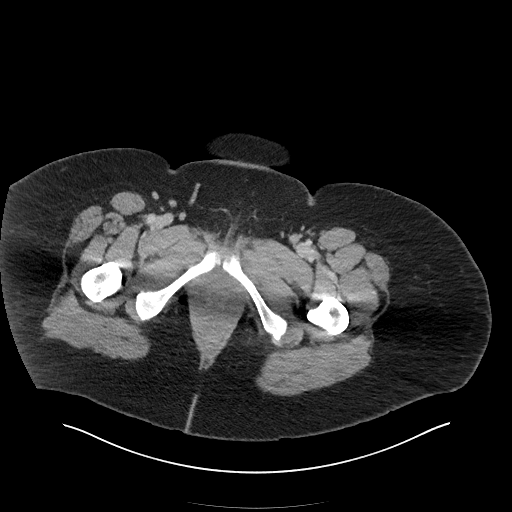
[im 5/91  bone]
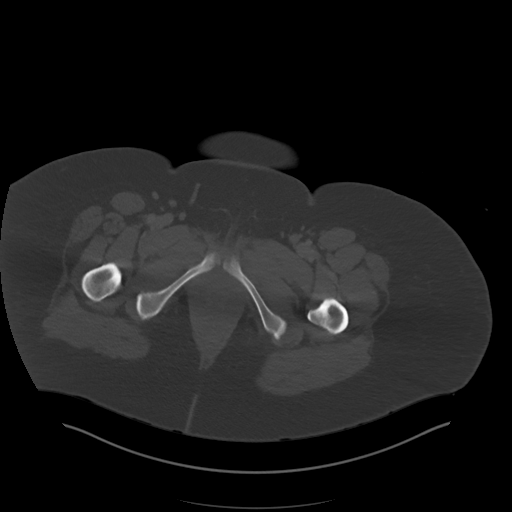
[im 14/91  soft-tissue]
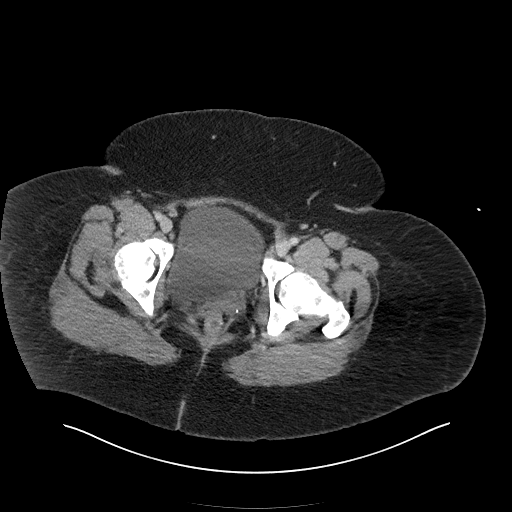
[im 19/91  soft-tissue]
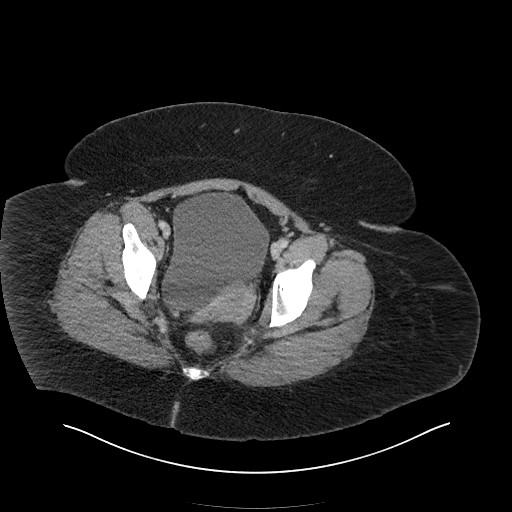
[im 28/91  soft-tissue]
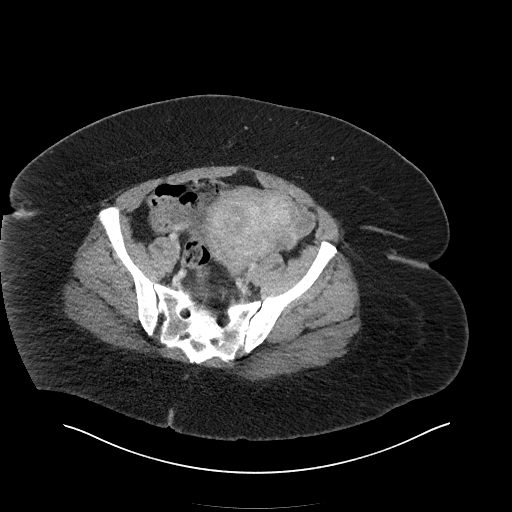
[im 32/91  soft-tissue]
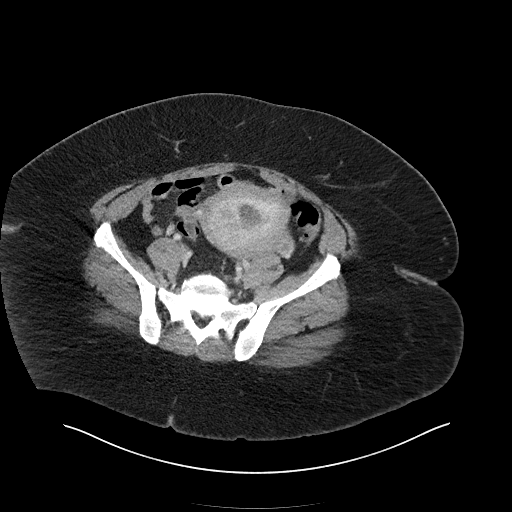
[im 41/91  soft-tissue]
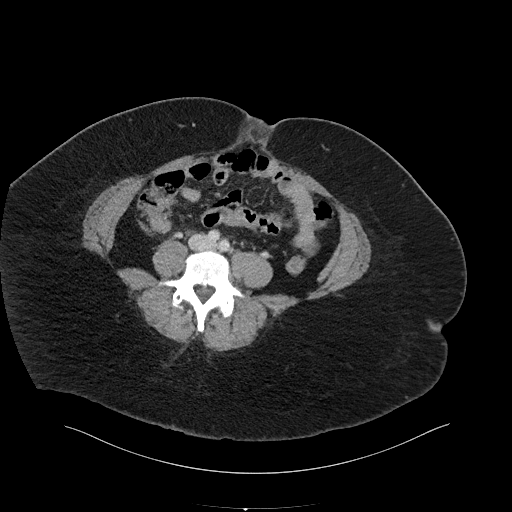
[im 46/91  soft-tissue]
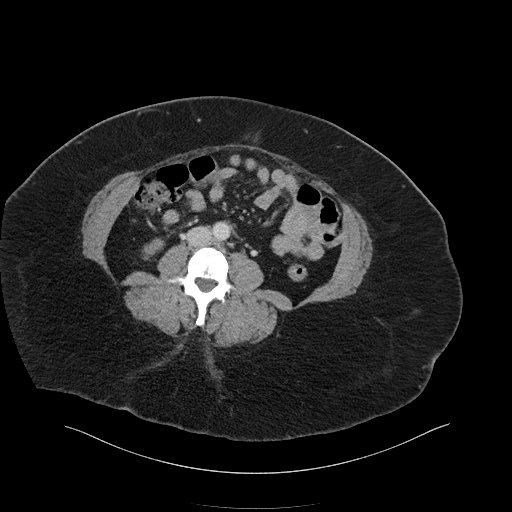
[im 50/91  soft-tissue]
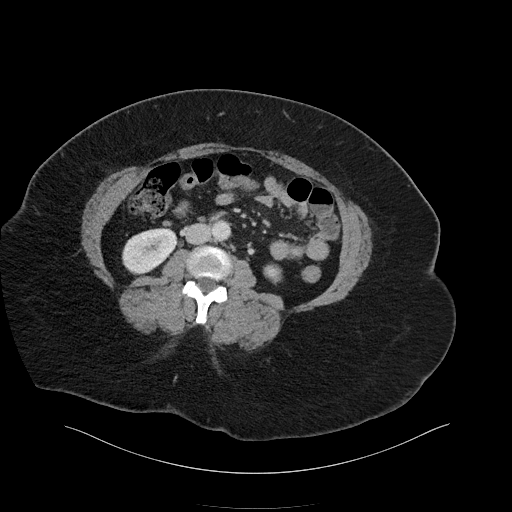
[im 59/91  soft-tissue]
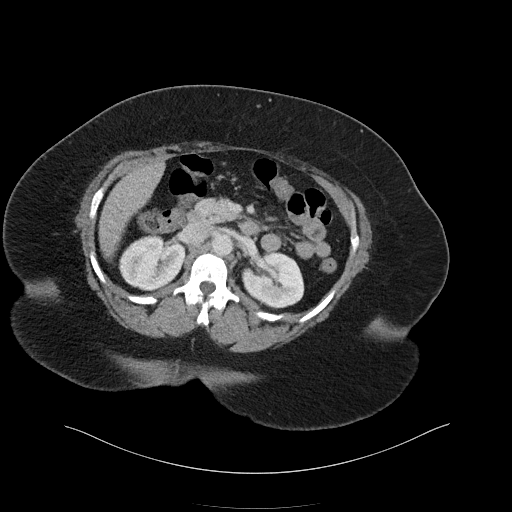
[im 59/91  bone]
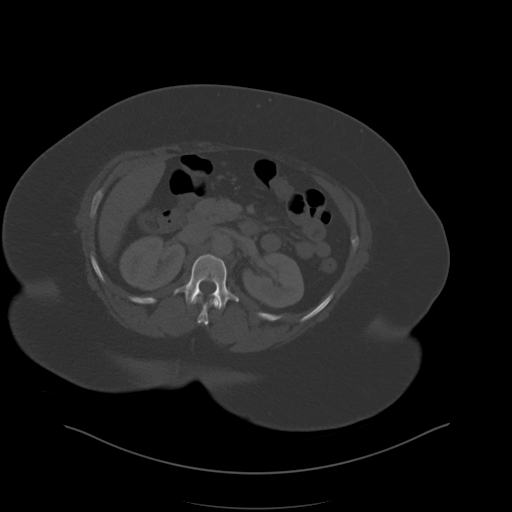
[im 64/91  soft-tissue]
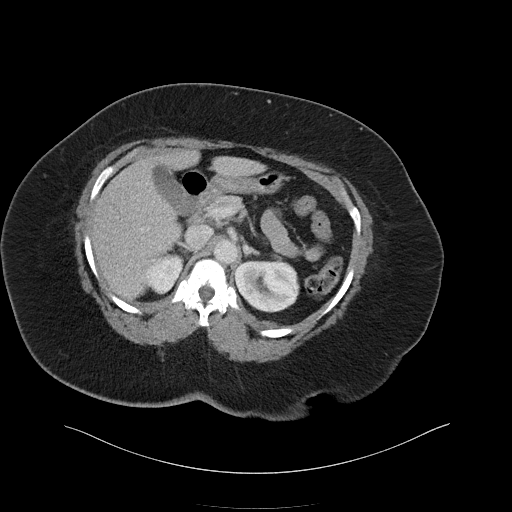
[im 73/91  soft-tissue]
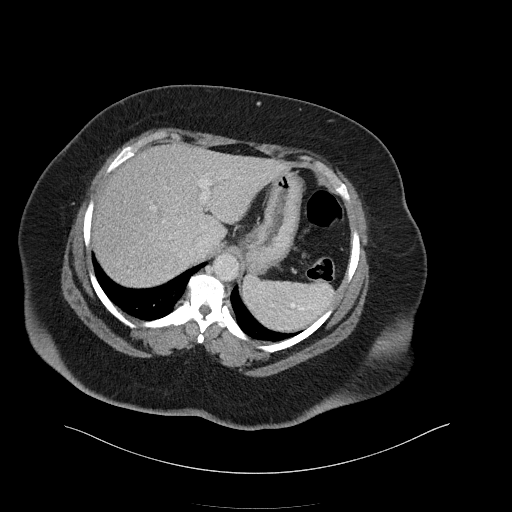
[im 77/91  soft-tissue]
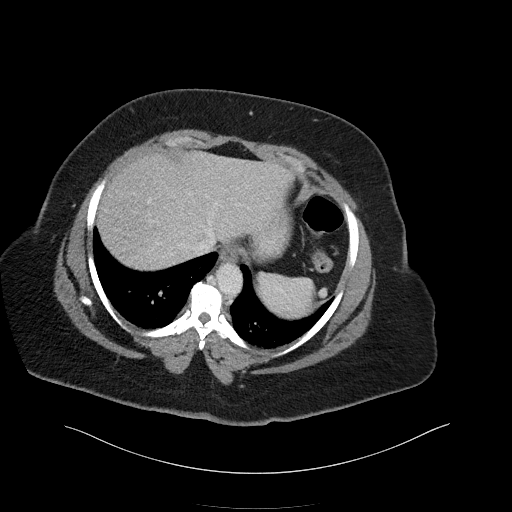
[im 86/91  soft-tissue]
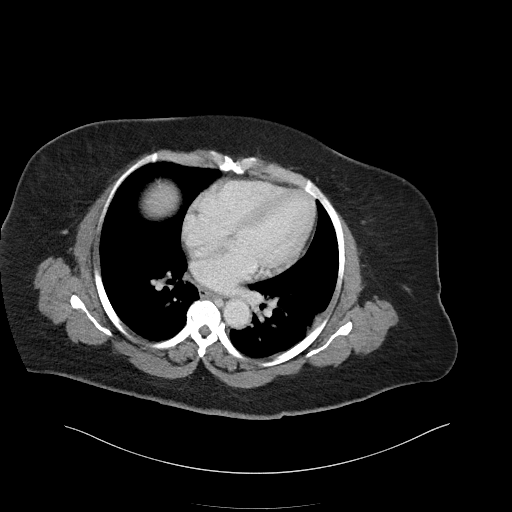

[Series 7: coronal soft tissue · coronal · 0.77mm/px · 3 of 101 slices shown]
[im 34/101  soft-tissue]
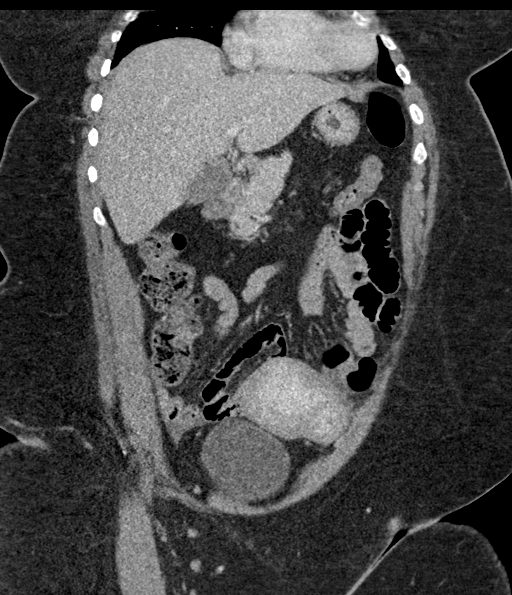
[im 45/101  soft-tissue]
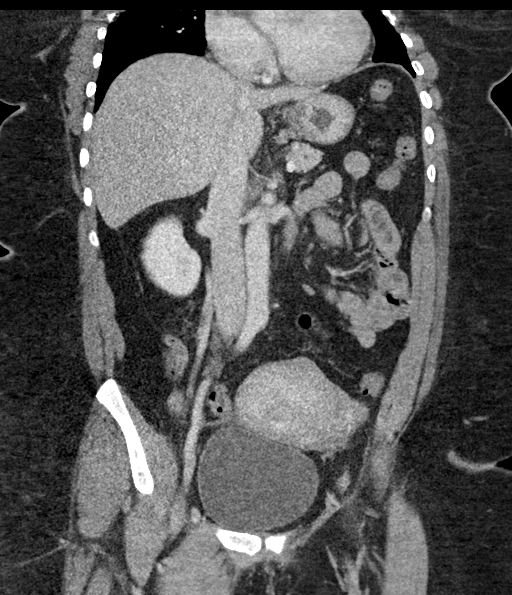
[im 56/101  soft-tissue]
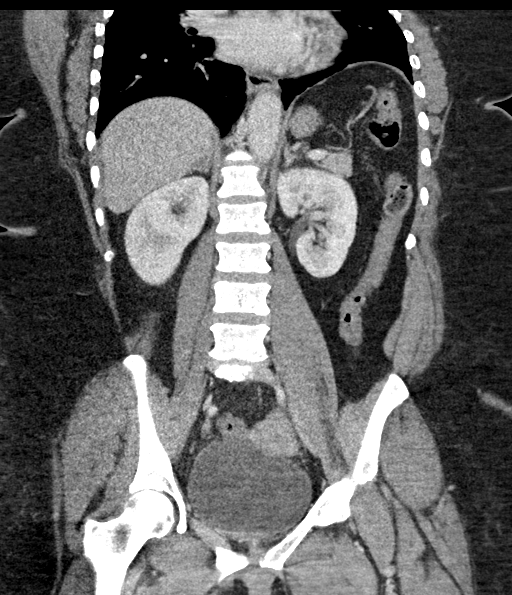

[16 of 46 positions shown; findings below may reference images not displayed]

FINDINGS: Lower chest: No acute abnormality.

Hepatobiliary: No focal liver abnormality is seen. No gallstones,
gallbladder wall thickening, or biliary dilatation.

Pancreas: Unremarkable. No pancreatic ductal dilatation or
surrounding inflammatory changes.

Spleen: Normal in size without focal abnormality.

Adrenals/Urinary Tract: Adrenal glands are unremarkable. Kidneys are
normal, without renal calculi, focal lesion, or hydronephrosis.
Bladder is unremarkable.

Stomach/Bowel: Stomach is within normal limits. No evidence of bowel
wall thickening, distention, or inflammatory changes. No
pneumatosis, pneumoperitoneum or portal venous gas.

Vascular/Lymphatic: No significant vascular findings are present. No
enlarged abdominal or pelvic lymph nodes.

Reproductive: Thickened endometrium. Uterine fundal fibroid
measuring approximately 4.2 cm. 3 cm cystic lower uterine
segment/cervix cystic mass. Small 2.6 cm left ovarian cyst.

Other: Fat containing umbilical hernia. No inguinal hernia. No
ascites.

Musculoskeletal: No acute osseous abnormality. No aggressive osseous
lesion. Grade 1 anterolisthesis of L4 on L5 secondary to severe
bilateral facet arthropathy. Severe bilateral foraminal stenosis at
L4-5.
IMPRESSION: 1. No acute abdominal or pelvic pathology.
2. Thickened endometrium. Given the patient's postmenopausal status,
gyn consultation is recommended. Further evaluation with endometrial
sampling or sonohysterogram may be helpful.
3. 3 cm cystic lower uterine segment/cervix cystic mass. Recommend
further evaluation with MRI of the pelvis without and with contrast.
4. Grade 1 anterolisthesis of L4 on L5 secondary to severe bilateral
facet arthropathy. Severe bilateral foraminal stenosis at L4-5.

## 2019-04-19 IMAGING — US US TRANSVAGINAL NON-OB
1 series · 13 of 25 positions shown · non-contrast
Comparison: None.

CLINICAL DATA: Left lower quadrant pain.

EXAM:
TRANSABDOMINAL AND TRANSVAGINAL ULTRASOUND OF PELVIS
DOPPLER ULTRASOUND OF OVARIES
TECHNIQUE: Both transabdominal and transvaginal ultrasound examinations of the
pelvis were performed. Transabdominal technique was performed for
global imaging of the pelvis including uterus, ovaries, adnexal
regions, and pelvic cul-de-sac.
It was necessary to proceed with endovaginal exam following the
transabdominal exam to visualize the endometrium and ovaries. Color
and duplex Doppler ultrasound was utilized to evaluate blood flow to
the ovaries.

[Series 1: us transvaginal non-ob · 13 of 105 slices shown]
[im 1/105]
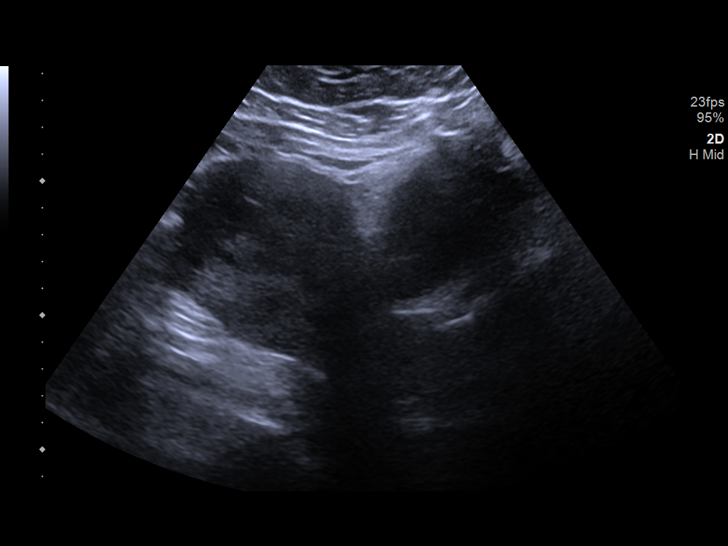
[im 9/105]
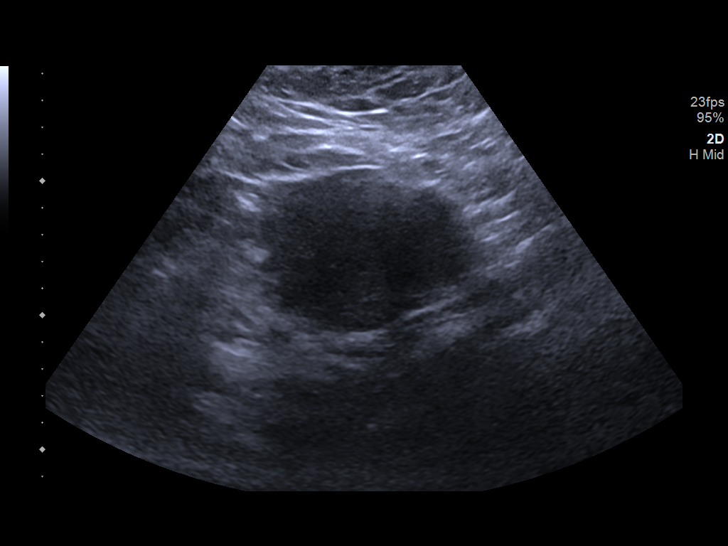
[im 18/105]
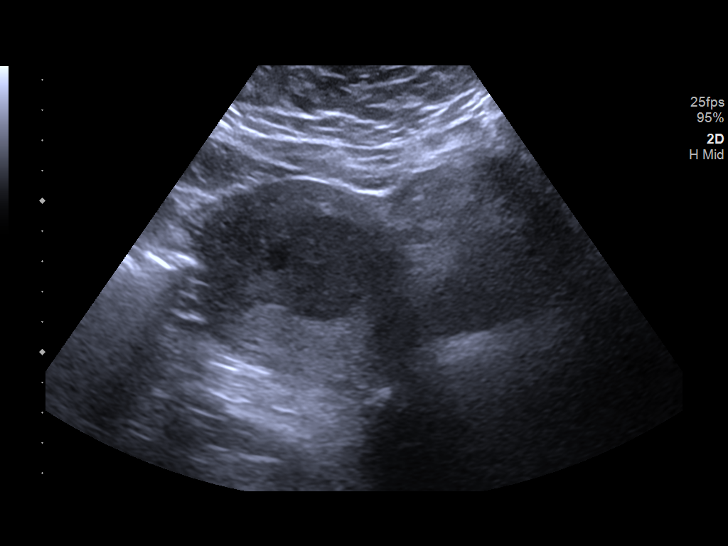
[im 27/105]
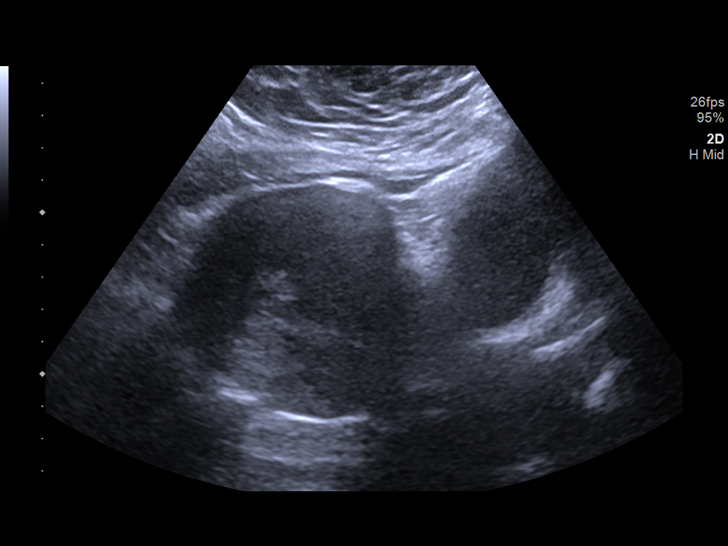
[im 35/105]
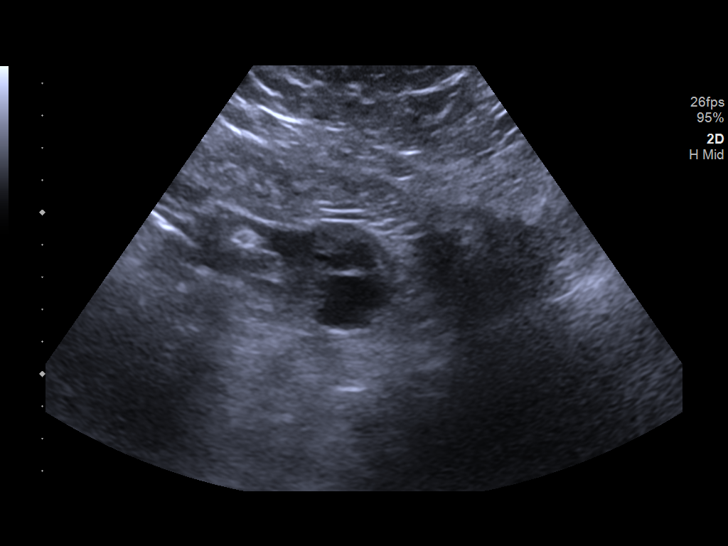
[im 44/105]
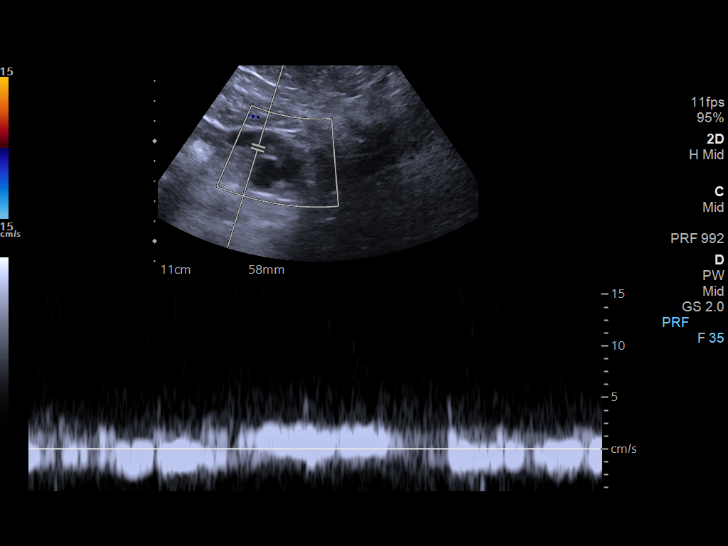
[im 53/105]
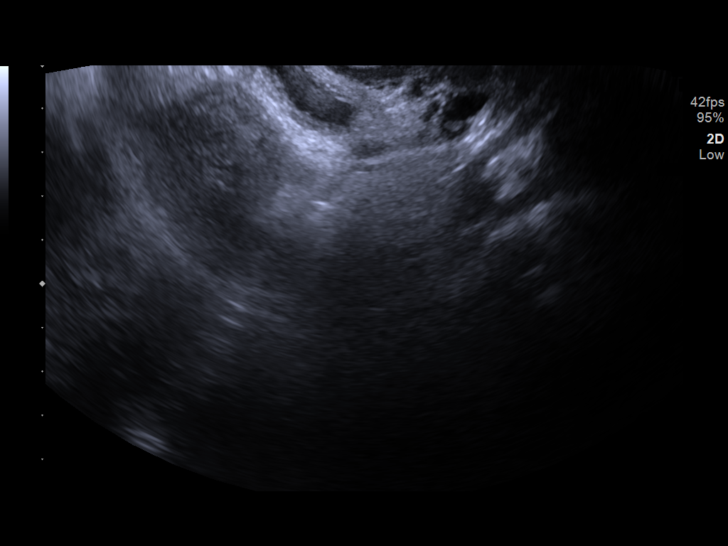
[im 61/105]
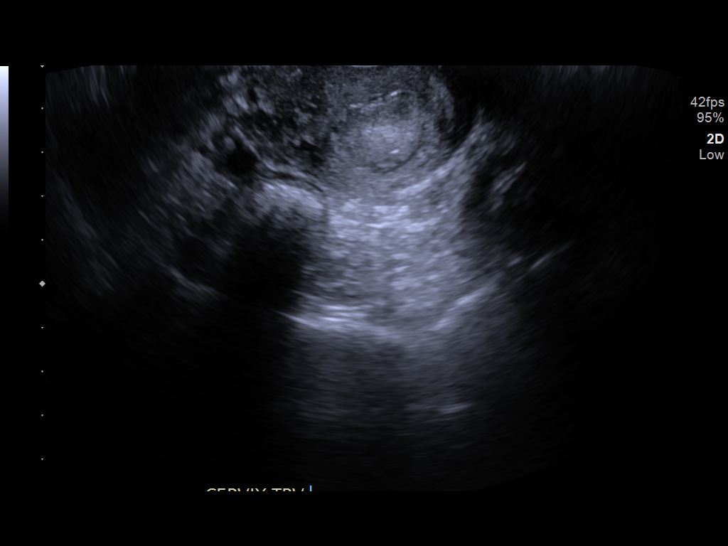
[im 70/105]
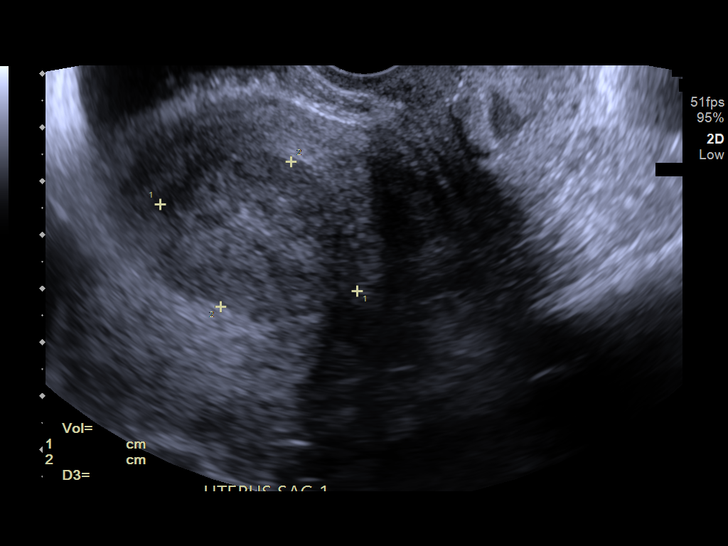
[im 79/105]
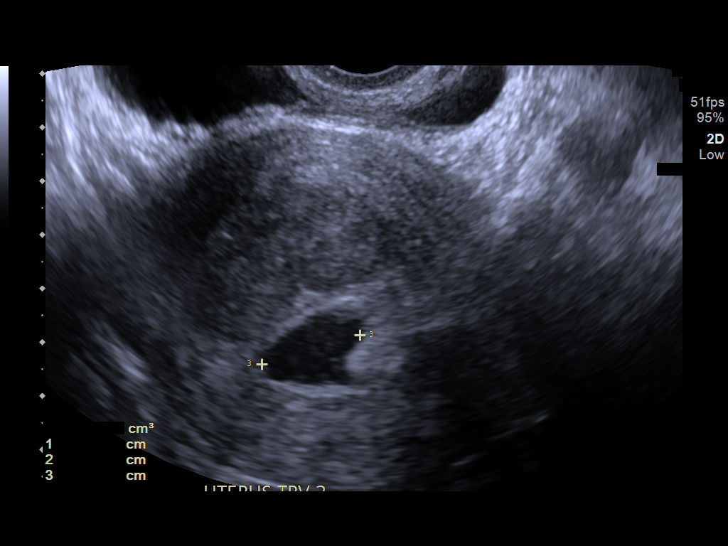
[im 87/105]
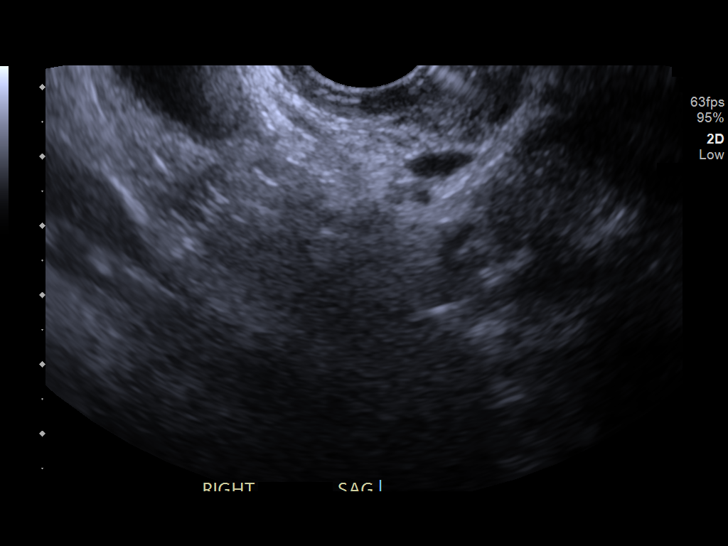
[im 96/105]
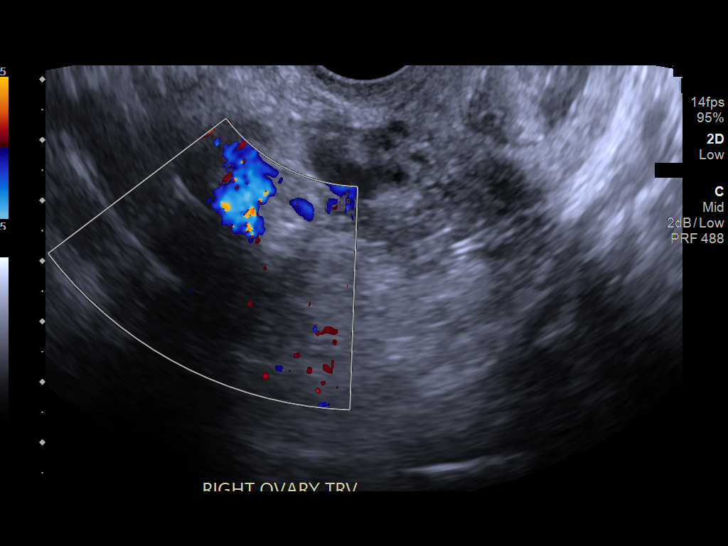
[im 105/105]
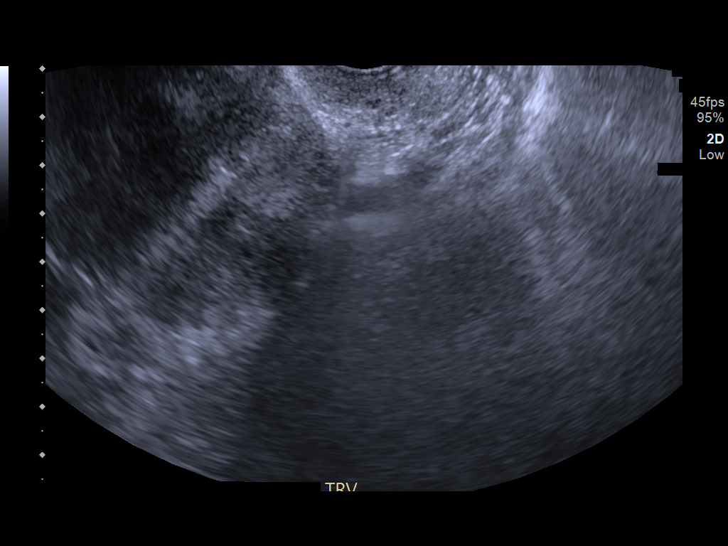

[13 of 25 positions shown; findings below may reference images not displayed]

FINDINGS: Uterus

Measurements: 13.4 x 7.0 x 7.1 cm = volume: 346 mL. There is a
fibroid in the anterior fundus measuring 4.0 x 3.0 x 3.8 cm. Another
mass either in the cervix or lower uterine segment measures 3.9 x
2.0 x 2.9 cm, heterogeneous in echogenicity.

Endometrium

Thickness: 15.6 mm.  Contains a 1.4 x 1.2 x 1.9 cm cyst.

Right ovary

Measurements: 2.0 x 1.7 x 2.1 cm = volume: 3.6 mL. Limited
visualization but grossly unremarkable.

Left ovary

Measurements: 3.7 x 3.0 x 3.8 cm = volume: 22.3 mL. Contains a 2.7 x
1.8 x 2.4 cm cyst of no significance.

Pulsed Doppler evaluation of both ovaries demonstrates normal
low-resistance arterial and venous waveforms.

Other findings

No abnormal free fluid.
IMPRESSION: 1. The endometrium is thickened to 15.6 mm. This is abnormal in a
postmenopausal woman. There is also a cyst within the thickened
endometrium which is abnormal as well. Recommend endometrial
sampling to exclude malignancy.
2. There is a mass which could arise from the cervix or the lower
uterine segment based on imaging. An MRI could further assess this
mass and the site of origin. Alternatively, recommend attention to
this region on physical exam during recommended endometrial
sampling.
3. There is a fibroid in the uterine fundus.
4. Evaluation of the right ovary is limited. However, the right
ovary is grossly unremarkable with arterial and venous blood flow.
5. 2.7 cm cyst in the left ovary of no significance. The left ovary
is otherwise normal. Arterial and venous blood flow seen in the left
ovary.

## 2020-06-27 ENCOUNTER — Encounter (HOSPITAL_COMMUNITY): Payer: Self-pay

## 2020-06-27 ENCOUNTER — Ambulatory Visit (HOSPITAL_COMMUNITY)
Admission: EM | Admit: 2020-06-27 | Discharge: 2020-06-27 | Disposition: A | Discharge status: Home / Self Care | Payer: Non-veteran care | Attending: Emergency Medicine | Admitting: Emergency Medicine

## 2020-06-27 ENCOUNTER — Other Ambulatory Visit: Payer: Self-pay

## 2020-06-27 DIAGNOSIS — M5442 Lumbago with sciatica, left side: Secondary | ICD-10-CM

## 2020-06-27 MED ORDER — KETOROLAC TROMETHAMINE 30 MG/ML IJ SOLN
30.0000 mg | Freq: Once | INTRAMUSCULAR | Status: AC
  Administered 2020-06-27: 30 mg via INTRAMUSCULAR

## 2020-06-27 MED ORDER — KETOROLAC TROMETHAMINE 30 MG/ML IJ SOLN
INTRAMUSCULAR | Status: AC
  Filled 2020-06-27: qty 1

## 2020-06-27 MED ORDER — CYCLOBENZAPRINE HCL 10 MG PO TABS: 10.0000 mg | ORAL_TABLET | Freq: Every day | ORAL | 0 refills | Status: AC

## 2020-06-27 NOTE — Discharge Instructions (Addendum)
Can continue use of naproxen, use twice a day for the next 3-5 days then as needed  Can use flexeril as needed before bed for additional relief  Can use heating pad 15 minute intervals mainly focusing on lower back  Can follow up with primary doctor for persistent symptoms

## 2020-06-27 NOTE — ED Provider Notes (Signed)
[place]    CSN: [id number] Arrival date & time: 06/27/20  1147      History   Chief Complaint Chief Complaint  Patient presents with  . Leg Pain    HPI Teresa Mann is a 60 y.o. female.   Patient presents with left lower back pain radiating down left leg beginning two months ago. Associated numbness and tingling. Worsened with bending. ROM intact. Denies urinary changes. History of sciatica when in [employer].  Taking naproxen as needed with minimal relief.   Concerned with bruise noticed today on left lateral leg. Denies injury or trauma, worsened leg pain, leg swelling, redness.   Past Medical History:  Diagnosis Date  . Arthritis of knee   . Endometriosis   . Fibroids   . Ovarian cyst   . PTSD (post-traumatic stress disorder)     Patient Active Problem List   Diagnosis Date Noted  . Thickened endometrium 06/04/2018  . Fibroid 06/04/2018    History reviewed. No pertinent surgical history.  OB History   No obstetric history on file.      Home Medications    Prior to Admission medications   Medication Sig Start Date End Date Taking? Authorizing Provider  cyclobenzaprine (FLEXERIL) 10 MG tablet Take 1 tablet (10 mg total) by mouth at bedtime. 06/27/20  Yes Dishawn Bhargava, Leitha Schuller, NP  acetaminophen (TYLENOL) 500 MG tablet Take 1,000 mg by mouth every 6 (six) hours as needed for mild pain.    [provider]  albuterol (PROVENTIL HFA;VENTOLIN HFA) 108 (90 Base) MCG/ACT inhaler Inhale 1-2 puffs into the lungs every 6 (six) hours as needed for wheezing or shortness of breath. 04/08/18   Wieters, Hallie C, PA-C  ibuprofen (ADVIL,MOTRIN) 600 MG tablet Take 1 tablet (600 mg total) by mouth every 6 (six) hours as needed. 04/08/18   Wieters, Hallie C, PA-C  misoprostol (CYTOTEC) 200 MCG tablet Take 3 pills by mouth the night before biopsy. Patient not taking: Reported on 06/04/2018 06/04/18 11/06/18  Emily Filbert, MD    Family History History reviewed. No  pertinent family history.  Social History Social History   Tobacco Use  . Smoking status: Never Smoker  . Smokeless tobacco: Never Used  Vaping Use  . Vaping Use: Never used  Substance Use Topics  . Alcohol use: Never  . Drug use: Never     Allergies   Patient has no known allergies.   Review of Systems Review of Systems  Constitutional: Negative.   Respiratory: Negative.   Cardiovascular: Negative.   Genitourinary: Negative.   Musculoskeletal: Positive for back pain. Negative for arthralgias, gait problem, joint swelling, myalgias, neck pain and neck stiffness.  Skin: Negative.   Neurological: Positive for numbness. Negative for dizziness, tremors, seizures, syncope, facial asymmetry, speech difficulty, weakness, light-headedness and headaches.     Physical Exam Triage Vital Signs ED Triage Vitals  Enc Vitals Group     BP 06/27/20 1247 (!) 159/93     Pulse Rate 06/27/20 1247 74     Resp 06/27/20 1247 20     Temp 06/27/20 1247 98.1 F (36.7 C)     Temp src --      SpO2 06/27/20 1247 98 %     Weight --      Height --      Head Circumference --      Peak Flow --      Pain Score 06/27/20 1241 9     Pain Loc --  Pain Edu? --      Excl. in [place]? --    No data found.  Updated Vital Signs BP (!) 159/93   Pulse 74   Temp 98.1 F (36.7 C)   Resp 20   SpO2 98%   Visual Acuity Right Eye Distance:   Left Eye Distance:   Bilateral Distance:    Right Eye Near:   Left Eye Near:    Bilateral Near:     Physical Exam Constitutional:      Appearance: Normal appearance. She is obese.  HENT:     Head: Normocephalic.     Nose: Nose normal.  Eyes:     Extraocular Movements: Extraocular movements intact.  Pulmonary:     Effort: Pulmonary effort is normal.  Musculoskeletal:     Cervical back: Normal and normal range of motion.     Thoracic back: Normal.     Lumbar back: Tenderness present. No swelling, edema, spasms or bony tenderness. Normal range of  motion.       Back:  Skin:         Comments: Quarter sized bruise on left lateral leg   Neurological:     Mental Status: She is alert and oriented to person, place, and time. Mental status is at baseline.  Psychiatric:        Mood and Affect: Mood normal.        Behavior: Behavior normal.        Thought Content: Thought content normal.        Judgment: Judgment normal.      UC Treatments / Results  Labs (all labs ordered are listed, but only abnormal results are displayed) Labs Reviewed - No data to display  EKG   Radiology No results found.  Procedures Procedures (including critical care time)  Medications Ordered in UC Medications - No data to display  Initial Impression / Assessment and Plan / UC Course  I have reviewed the triage vital signs and the nursing notes.  Pertinent labs & imaging results that were available during my care of the patient were reviewed by me and considered in my medical decision making (see chart for details).  Acute left low back pain with sciatica  1. Flexeril 10 mg bedtime as needed 2.Increase use of naproxen 500 mg bid for 3-5 days 3. Heating pad 15 minute intervals  4. No signs of embolism in leg, bruise is singular, stable, healing appropriately. No associated pain, swelling, redness or warmth, discussed with patient  Final Clinical Impressions(s) / UC Diagnoses   Final diagnoses:  Acute left-sided low back pain with left-sided sciatica     Discharge Instructions     Can continue use of naproxen, use twice a day for the next 3-5 days then as needed  Can use flexeril as needed before bed for additional relief  Can use heating pad 15 minute intervals mainly focusing on lower back  Can follow up with primary doctor for persistent symptoms      ED Prescriptions    Medication Sig Dispense Auth. Provider   cyclobenzaprine (FLEXERIL) 10 MG tablet Take 1 tablet (10 mg total) by mouth at bedtime. 10 tablet Hans Eden, NP     PDMP not reviewed this encounter.   Hans Eden, NP 06/27/20 1335

## 2020-06-27 NOTE — ED Triage Notes (Signed)
Pt in with c/o left leg pain that has been going on for about 2 months  Pt states the pain starts at her back and radiates down her leg  Pt has been taking naproxen for sxs

## 2021-02-12 ENCOUNTER — Emergency Department (HOSPITAL_COMMUNITY)
Admission: EM | Admit: 2021-02-12 | Discharge: 2021-02-13 | Disposition: A | Discharge status: Home / Self Care | Payer: No Typology Code available for payment source | Attending: Medical | Admitting: Medical

## 2021-02-12 ENCOUNTER — Emergency Department (HOSPITAL_COMMUNITY): Payer: No Typology Code available for payment source

## 2021-02-12 ENCOUNTER — Other Ambulatory Visit: Payer: Self-pay

## 2021-02-12 ENCOUNTER — Encounter (HOSPITAL_COMMUNITY): Payer: Self-pay | Admitting: Emergency Medicine

## 2021-02-12 DIAGNOSIS — H538 Other visual disturbances: Secondary | ICD-10-CM | POA: Diagnosis not present

## 2021-02-12 DIAGNOSIS — Z5321 Procedure and treatment not carried out due to patient leaving prior to being seen by health care provider: Secondary | ICD-10-CM | POA: Insufficient documentation

## 2021-02-12 DIAGNOSIS — R519 Headache, unspecified: Secondary | ICD-10-CM | POA: Insufficient documentation

## 2021-02-12 DIAGNOSIS — R030 Elevated blood-pressure reading, without diagnosis of hypertension: Secondary | ICD-10-CM | POA: Diagnosis not present

## 2021-02-12 LAB — COMPREHENSIVE METABOLIC PANEL
ALT: 19 U/L (ref 0–44)
AST: 17 U/L (ref 15–41)
Albumin: 3.7 g/dL (ref 3.5–5.0)
Alkaline Phosphatase: 107 U/L (ref 38–126)
Anion gap: 8 (ref 5–15)
BUN: 16 mg/dL (ref 6–20)
CO2: 25 mmol/L (ref 22–32)
Calcium: 9.5 mg/dL (ref 8.9–10.3)
Chloride: 111 mmol/L (ref 98–111)
Creatinine, Ser: 1.16 mg/dL — ABNORMAL HIGH (ref 0.44–1.00)
GFR, Estimated: 54 mL/min — ABNORMAL LOW (ref >60)
Glucose, Bld: 92 mg/dL (ref 70–99)
Potassium: 3.8 mmol/L (ref 3.5–5.1)
Sodium: 144 mmol/L (ref 135–145)
Total Bilirubin: 0.5 mg/dL (ref 0.3–1.2)
Total Protein: 7.2 g/dL (ref 6.5–8.1)

## 2021-02-12 LAB — CBC WITH DIFFERENTIAL/PLATELET
Abs Immature Granulocytes: 0.01 10*3/uL (ref 0.00–0.07)
Basophils Absolute: 0 10*3/uL (ref 0.0–0.1)
Basophils Relative: 1 %
Eosinophils Absolute: 0.2 10*3/uL (ref 0.0–0.5)
Eosinophils Relative: 2 %
HCT: 45.5 % (ref 36.0–46.0)
Hemoglobin: 14.5 g/dL (ref 12.0–15.0)
Immature Granulocytes: 0 %
Lymphocytes Relative: 35 %
Lymphs Abs: 2.5 10*3/uL (ref 0.7–4.0)
MCH: 27.2 pg (ref 26.0–34.0)
MCHC: 31.9 g/dL (ref 30.0–36.0)
MCV: 85.2 fL (ref 80.0–100.0)
Monocytes Absolute: 0.5 10*3/uL (ref 0.1–1.0)
Monocytes Relative: 7 %
Neutro Abs: 3.9 10*3/uL (ref 1.7–7.7)
Neutrophils Relative %: 55 %
Platelets: 175 10*3/uL (ref 150–400)
RBC: 5.34 MIL/uL — ABNORMAL HIGH (ref 3.87–5.11)
RDW: 14.5 % (ref 11.5–15.5)
WBC: 7 10*3/uL (ref 4.0–10.5)
nRBC: 0 % (ref 0.0–0.2)

## 2021-02-12 NOTE — ED Triage Notes (Signed)
C/o headache and blurred vision since yesterday.  No arm drift.  States she checked BP and it was 153/98.  Denies numbness/weakness.

## 2021-02-12 NOTE — ED Provider Notes (Signed)
Emergency Medicine Provider Triage Evaluation Note  Teresa Mann , a 60 y.o. female  was evaluated in triage.  Pt complains of gradual onset, constant, achy, right-sided headache that began last night.  Also complains of blurry vision.  No loss of vision.  She decided to check her blood pressure last night and states that it was elevated in the 150s.  She does not take anything for blood pressure at home.  He states that throughout the day today her headache has continued.  She has had no relief with naproxen and Tylenol prompting ED visit today.  She does report history of migraines however states this feels different.  He denies any photophobia, nausea, vomiting, speech changes, unilateral weakness or numbness  Review of Systems  Positive: + headache, blurry vision Negative: - nausea, vomiting, photophobia, weakness/numbness  Physical Exam  BP (!) 182/109   Pulse 89   Temp 98.4 F (36.9 C) (Oral)   Resp 17   SpO2 100%  Gen:   Awake, no distress   Resp:  Normal effort  MSK:   Moves extremities without difficulty  Other:  CN intact. No pronator drift. Normal finger to nose. Strength 5/5 to BUE and BLEs. Sensation intact throughout.   Medical Decision Making  Medically screening exam initiated at 4:47 PM.  Appropriate orders placed.  Teresa Mann was informed that the remainder of the evaluation will be completed by another provider, this initial triage assessment does not replace that evaluation, and the importance of remaining in the ED until their evaluation is complete.     Eustaquio Maize, PA-C 02/12/21 1649    Godfrey Pick, MD 02/13/21 [phone]

## 2021-02-13 NOTE — ED Notes (Signed)
Patient left on own accord °

## 2021-10-02 ENCOUNTER — Other Ambulatory Visit: Payer: Self-pay | Admitting: Surgery

## 2021-10-02 ENCOUNTER — Other Ambulatory Visit (HOSPITAL_COMMUNITY): Payer: Self-pay | Admitting: Surgery

## 2021-10-12 ENCOUNTER — Ambulatory Visit (HOSPITAL_COMMUNITY)
Admission: RE | Admit: 2021-10-12 | Discharge: 2021-10-12 | Disposition: A | Discharge status: Home / Self Care | Payer: No Typology Code available for payment source | Source: Ambulatory Visit | Attending: Surgery | Admitting: Surgery

## 2021-10-12 ENCOUNTER — Other Ambulatory Visit: Payer: Self-pay

## 2021-10-12 DIAGNOSIS — Z01818 Encounter for other preprocedural examination: Secondary | ICD-10-CM | POA: Diagnosis present

## 2021-10-13 ENCOUNTER — Encounter: Payer: Self-pay | Admitting: Dietician

## 2021-10-13 ENCOUNTER — Encounter: Payer: No Typology Code available for payment source | Attending: Surgery | Admitting: Dietician

## 2021-10-13 DIAGNOSIS — Z713 Dietary counseling and surveillance: Secondary | ICD-10-CM | POA: Diagnosis present

## 2021-10-13 DIAGNOSIS — E669 Obesity, unspecified: Secondary | ICD-10-CM | POA: Diagnosis not present

## 2021-10-13 DIAGNOSIS — Z6841 Body Mass Index (BMI) 40.0 and over, adult: Secondary | ICD-10-CM | POA: Diagnosis not present

## 2021-10-13 NOTE — Progress Notes (Signed)
Nutrition Assessment for Bariatric Surgery Medical Nutrition Therapy Appt Start Time: 9:35    End Time: 10:33  Patient was seen on 10/13/2021 for Pre-Operative Nutrition Assessment. Letter of approval faxed to [facility] bariatric surgery program coordinator on 07/14/203.   Referral stated Supervised Weight Loss (SWL) visits needed: 0  Not cleared at this time:  Pt to follow up for minimum of one more visit to assist pt with progressing through stages of change/further nutrition education. RD advised pt that this follow up visit is not mandated through insurance. Pt verbalized agreement.   Planned surgery: Sleeve Gastrectomy Pt expectation of surgery: to have knee surgery, down to 40 BMI, or 200 lbs.   NUTRITION ASSESSMENT   Anthropometrics  Start weight at [facility]: 262.8 lbs (date: 10/13/2021)  Height: 62 in BMI: 48.07 kg/m2     Clinical  Medical hx: PTSD, ovarian cyst, uterine fibroids, endometriosis, knee arthritis, anxiety, hypertension, sleep apnea, obesity, migraines  Medications: Tylenol, albuterol, flexeril, motrin, trazodone, sertraline, vit D, amlodipine, cholecalciferol, naproxen, Celebrex Labs: RBC 5.11 Notable signs/symptoms: none noted Any previous deficiencies? No  Micronutrient Nutrition Focused Physical Exam: Hair: No issues observed Eyes: No issues observed Mouth: No issues observed Neck: No issues observed Nails: No issues observed Skin: No issues observed  Lifestyle & Dietary Hx  Patient lives with daughter and granddaughter. The pt and her daughter performs the food shopping and the pt and daughter prepares the meals. She reports that she typically skips or misses 8 out of 21 possible meals per week. She may have 10 meals per week that are take-out or at a restaurant. 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 She feels that she binge eats by eating more when she knows she is full, and has felt shame and/or guilt after eating too much food.  She  denies having used laxatives or vomiting to facilitate weight loss. Patient has sought emotional/psychological support for eating issues. She admits to emotional eating during times of stress. She states that she knows the difference between hunger and thirst and can tell when she is full.  Pt suffers from depression, takes medication and sees a therapist through the [place].  Pt states she works through depression by singing, going to church, playing with grandchildren, and goes out with friends.  Pt has access to mental health resources through the [place]. Pt states she has a strong belief in God and trusts in him. Pt states she walks but her knee hurts.  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 Pt states one of her goals is to be able to get out in the yard and garden, which will help with her mental health.  Pt states also she wants to be able to sustain bathing without assistance.  24-Hr Dietary Recall First Meal: skip, coffee, toast Snack: nuts Second Meal: skip or salad from [employer] lemon pepper wings Snack: nuts Third Meal: greens, chicken, rice with gravy Snack:  Beverages: sweet tea with lemon, water   Estimated Energy Needs Calories: 1500   NUTRITION DIAGNOSIS  Overweight/obesity ([place]-3.3) related to past poor dietary habits and physical inactivity as evidenced by patient w/ planned Sleeve Gastrectomy surgery following dietary guidelines for continued weight loss.    NUTRITION INTERVENTION  Nutrition counseling (C-1) and education (E-2) to facilitate bariatric surgery goals.  Educated pt on micronutrient deficiencies post surgery and strategies to mitigate that risk   Pre-Op Goals Reviewed with  the Patient Track food and beverage intake (pen and paper, MyFitness Pal, Baritastic app, etc.) Make healthy food choices while monitoring portion sizes Consume 3 meals per day or try to eat every 3-5 hours Avoid concentrated sugars and  fried foods Keep sugar & fat in the single digits per serving on food labels Practice CHEWING your food (aim for applesauce consistency) Practice not drinking 15 minutes before, during, and 30 minutes after each meal and snack Avoid all carbonated beverages (ex: soda, sparkling beverages)  Limit caffeinated beverages (ex: coffee, tea, energy drinks) Avoid all sugar-sweetened beverages (ex: regular soda, sports drinks)  Avoid alcohol  Aim for 64-100 ounces of FLUID daily (with at least half of fluid intake being plain water)  Aim for at least 60-80 grams of PROTEIN daily Look for a liquid protein source that contains ?15 g protein and ?5 g carbohydrate (ex: shakes, drinks, shots) Make a list of non-food related activities Physical activity is an important part of a healthy lifestyle so keep it moving! The goal is to reach 150 minutes of exercise per week, including cardiovascular and weight baring activity.  *Goals that are bolded indicate the pt would like to start working towards these  Handouts Provided Include  Bariatric Surgery handouts (Nutrition Visits, Pre-Op Goals, Protein Shakes, Vitamins & Minerals)  Learning Style & Readiness for Change Teaching method utilized: Visual & Auditory  Demonstrated degree of understanding via: Teach Back  Readiness Level: contemplative  Barriers to learning/adherence to lifestyle change: mobility  RD's Notes for Next Visit Progress toward pt chosen goals     MONITORING & EVALUATION Dietary intake, weekly physical activity, body weight, and pre-op goals reached at next nutrition visit.    Next Steps  Pt to follow up for minimum of one more visit to assist pt with progressing through stages of change/further nutrition education.

## 2021-10-20 ENCOUNTER — Ambulatory Visit: Payer: No Typology Code available for payment source | Admitting: Dietician

## 2021-11-16 ENCOUNTER — Encounter: Payer: No Typology Code available for payment source | Attending: Surgery | Admitting: Dietician

## 2021-11-16 ENCOUNTER — Encounter: Payer: Self-pay | Admitting: Dietician

## 2021-11-16 DIAGNOSIS — E669 Obesity, unspecified: Secondary | ICD-10-CM | POA: Insufficient documentation

## 2021-11-16 NOTE — Progress Notes (Signed)
Supervised Weight Loss Visit Bariatric Nutrition Education Appt Start Time: 10:30    End Time: 11:00  Planned surgery: Sleeve Gastrectomy Pt expectation of surgery: to have knee surgery, down to 40 BMI, or 200 lbs.  Referral stated Supervised Weight Loss (SWL) visits needed: 0  Pt completed visits.   Pt has cleared nutrition requirements.   NUTRITION ASSESSMENT  Anthropometrics  Start weight at [facility]: 262.8 lbs (date: 10/13/2021)  Height: 62 in Weight today: 260.5 lbs BMI: 47.65 kg/m2     Clinical  Medical hx: PTSD, ovarian cyst, uterine fibroids, endometriosis, knee arthritis, anxiety, hypertension, sleep apnea, obesity, migraines  Medications: Tylenol, albuterol, flexeril, motrin, trazodone, sertraline, vit D, amlodipine, cholecalciferol, naproxen, Celebrex Labs: RBC 5.11 Notable signs/symptoms: none noted Any previous deficiencies? No  Lifestyle & Dietary Hx  Pt states she has reduced the amount of sweet tea, stating she might have a glass every other day. Pt states her knee has gotten worse since last visit, stating she does water aerobics for physical activity. Pt states she can improve on eating through out the day, but states she has snacks available. Pt states she avoids mirrors.  Pt states she see a therapist for depression.  Pt states she has been prescribed medication (trazodone) for sleep. Pt states she turns to food for comfort, stating the last episode she did not turn to food.  Estimated daily fluid intake: 64 oz Supplements: vit D Current average weekly physical activity: water aerobics  24-Hr Dietary Recall First Meal: water, granola bar Snack:  Second Meal: salad, fruit, water Snack:  Third Meal: backed chicken, meatballs with gravy over rice Snack: baked lays chips Beverages: water, sweet tea (every other day)  Estimated Energy Needs Calories: 1500   NUTRITION DIAGNOSIS  Overweight/obesity ([place]-3.3) related to past poor dietary habits and physical  inactivity as evidenced by patient w/ planned Sleeve Gastrectomy surgery following dietary guidelines for continued weight loss.   NUTRITION INTERVENTION  Nutrition counseling (C-1) and education (E-2) to facilitate bariatric surgery goals.  Pre-Op Goals Reviewed with the Patient Track food and beverage intake (pen and paper, MyFitness Pal, Baritastic app, etc.) Make healthy food choices while monitoring portion sizes Consume 3 meals per day or try to eat every 3-5 hours. Prepare snacks as needed Avoid concentrated sugars and fried foods Keep sugar & fat in the single digits per serving on food labels Practice CHEWING your food (aim for applesauce consistency) Practice not drinking 15 minutes before, during, and 30 minutes after each meal and snack Avoid all carbonated beverages (ex: soda, sparkling beverages)  Limit caffeinated beverages (ex: coffee, tea, energy drinks) Avoid all sugar-sweetened beverages (ex: regular soda, sports drinks)  Avoid alcohol  Aim for 64-100 ounces of FLUID daily (with at least half of fluid intake being plain water)  Aim for at least 60-80 grams of PROTEIN daily Look for a liquid protein source that contains ?15 g protein and ?5 g carbohydrate (ex: shakes, drinks, shots) Make a list of non-food related activities Physical activity is an important part of a healthy lifestyle so keep it moving! The goal is to reach 150 minutes of exercise per week, including cardiovascular and weight baring activity.  *Goals that are bolded indicate the pt would like to start working towards these   Handouts Provided Include  Dish Up A Healthy Meal poster/handout  Learning Style & Readiness for Change Teaching method utilized: Visual & Auditory  Demonstrated degree of understanding via: Teach Back  Readiness Level: contemplative Barriers to learning/adherence to  lifestyle change: mobility, food preferences  RD's Notes for next Visit    MONITORING &  EVALUATION Dietary intake, weekly physical activity, body weight, and pre-op goals in 1 month.   Next Steps  Pt has completed visits. No further supervised visits required/recomended  Patient is to follow up at [facility] for Pre-Op Class >2 weeks before surgery for further nutrition education.

## 2021-12-14 ENCOUNTER — Ambulatory Visit (INDEPENDENT_AMBULATORY_CARE_PROVIDER_SITE_OTHER): Payer: Self-pay | Admitting: Licensed Clinical Social Worker

## 2021-12-14 DIAGNOSIS — F4310 Post-traumatic stress disorder, unspecified: Secondary | ICD-10-CM
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
## 2021-12-28 ENCOUNTER — Ambulatory Visit: Payer: Self-pay | Admitting: Surgery

## 2021-12-28 NOTE — H&P (Signed)
Teresa Mann [id number]   Referring Provider:  Self   Subjective   Chief Complaint: Follow-up (Return weight loss )     History of Present Illness: Returns for follow-up regarding surgical treatment of morbid obesity.  She has completed the bariatric pathway with no areas identified, and surgery is scheduled for the end of October.  She is appropriately nervous about upcoming surgery, but looking forward to getting it done.  She has her preop nutrition class as well as her preop anesthesia appointment scheduled.  She has a few questions to discuss today.  Upper GI/chest x-ray: Negative for hiatal hernia or reflux, nonspecific esophageal motility disorder Dietitian: Cleared; Teresa Mann RD Psychology: cleared; Teresa Bickers LCSW  Initial visit 09/28/21: Very pleasant 61 year old woman with history of PTSD, ovarian cyst, uterine fibroids, endometriosis, knee arthritis, anxiety, hypertension, sleep apnea, obesity, migraines who presents for evaluation regarding surgical treatment of morbid obesity. She has been thinking about surgery for several years, when she first started investigating the most common procedure was the Lap-Band and she was not interested in that.  She has struggled with her weight for many years.  She has tried behavior modification as well as working with a Microbiologist, liquid diets, etc. with limited and transient success.  One of the big reason she is hoping to lose weight is so that she can have any surgery due to significant arthritis.  She also has a new grandchild that she wants to be able to spend a lot of time with, improving and increasing the quantity and quality of her life is also a priority.  She does take Celebrex as well as ibuprofen for the arthritis.  Denies tobacco use.  Reports very minimal reflux symptoms intermittent and related to anxiety as well as food choices. Denies any previous abdominal surgery.  She is an Civil Service fast streamer, currently works as a  Optometrist for [employer] and gets her care from Dr. Reed Pandy at the [facility]. Review of Systems: A complete review of systems was obtained from the patient.  I have reviewed this information and discussed as appropriate with the patient.  See HPI as well for other ROS.   Medical History: Past Medical History:  Diagnosis Date   Anxiety    Arthritis    Hypertension    Sleep apnea     There is no problem list on file for this patient.   History reviewed. No pertinent surgical history.   No Known Allergies  Current Outpatient Medications on File Prior to Visit  Medication Sig Dispense Refill   amLODIPine (NORVASC) 5 MG tablet Take 1 tablet by mouth once daily     celecoxib (CELEBREX) 200 MG capsule Take 1 capsule by mouth once daily     cholecalciferol (VITAMIN D3) 2,000 unit tablet Take 1 tablet by mouth once daily     No current facility-administered medications on file prior to visit.    Family History  Problem Relation Age of Onset   Obesity Mother    High blood pressure (Hypertension) Mother    Hyperlipidemia (Elevated cholesterol) Mother    Diabetes Mother    High blood pressure (Hypertension) Father    Hyperlipidemia (Elevated cholesterol) Father    Deep vein thrombosis (DVT or abnormal blood clot formation) Father    Obesity Sister    High blood pressure (Hypertension) Sister    High blood pressure (Hypertension) Brother      Social History   Tobacco Use  Smoking Status Never  Smokeless Tobacco Never     Social History   Socioeconomic History   Marital status: Divorced  Tobacco Use   Smoking status: Never   Smokeless tobacco: Never    Objective:    Vitals:   12/28/21 0859  BP: 138/86  Pulse: 106  Resp: 20  Temp: 37.1 C (98.7 F)  SpO2: 96%  Weight: (!) 121 kg (266 lb 12.8 oz)  Height: 155.6 cm (5' 1.26")     Body mass index is 49.98 kg/m.  Alert and well-appearing Unlabored respirations    Labs, Imaging and  Diagnostic Testing: Work from the [facility] [place] date 02/13/2021; CMP which is all normal,, hemoglobin A1c 6.1,  Assessment and Plan:  Diagnoses and all orders for this visit:  Morbid obesity (CMS-HCC)  Essential hypertension  Arthritis  Prediabetes  Sleep apnea, unspecified type  Other migraine without status migrainosus, not intractable  Anxiety     She remains a good candidate for bariatric surgery.  The patient is initially interested in gastric bypass because she is interested in the fact that it is reversible.  Given that she does not have significant diabetes or reflux, I do feel the sleeve would be a better option for her especially given her age and potential need for ongoing NSAID use for her arthritis.  We discussed the merits and trade-offs of both surgeries, and ultimately she would like to proceed with sleeve gastrectomy. We have previously discussed the surgery including technical aspects, the risks of bleeding, infection, pain, scarring, injury to intra-abdominal structures, staple line leak or abscess, chronic abdominal pain or nausea, new onset or worsened GERD, DVT/PE, pneumonia, heart attack, stroke, death, failure to reach weight loss goals and weight regain, hernia.  Discussed the typical peri-, and postoperative course.  Reiterated the importance of lifelong behavioral changes to combat the chronic and relapsing disease which is obesity.  Questions were welcomed and answered.  Plan to proceed with surgery next month as scheduled.  BMI 30= 160lb  [name] [name], MD

## 2021-12-28 NOTE — H&P (View-Only) (Signed)
Teresa Mann [id number]   Referring Provider:  Self   Subjective   Chief Complaint: Follow-up (Return weight loss )     History of Present Illness: Returns for follow-up regarding surgical treatment of morbid obesity.  She has completed the bariatric pathway with no areas identified, and surgery is scheduled for the end of October.  She is appropriately nervous about upcoming surgery, but looking forward to getting it done.  She has her preop nutrition class as well as her preop anesthesia appointment scheduled.  She has a few questions to discuss today.  Upper GI/chest x-ray: Negative for hiatal hernia or reflux, nonspecific esophageal motility disorder Dietitian: Cleared; Teresa Mann RD Psychology: cleared; Teresa Bickers LCSW  Initial visit 09/28/21: Very pleasant 61 year old woman with history of PTSD, ovarian cyst, uterine fibroids, endometriosis, knee arthritis, anxiety, hypertension, sleep apnea, obesity, migraines who presents for evaluation regarding surgical treatment of morbid obesity. She has been thinking about surgery for several years, when she first started investigating the most common procedure was the Lap-Band and she was not interested in that.  She has struggled with her weight for many years.  She has tried behavior modification as well as working with a Microbiologist, liquid diets, etc. with limited and transient success.  One of the big reason she is hoping to lose weight is so that she can have any surgery due to significant arthritis.  She also has a new grandchild that she wants to be able to spend a lot of time with, improving and increasing the quantity and quality of her life is also a priority.  She does take Celebrex as well as ibuprofen for the arthritis.  Denies tobacco use.  Reports very minimal reflux symptoms intermittent and related to anxiety as well as food choices. Denies any previous abdominal surgery.  She is an [employer] fast stream[employer], currently works as a  Optometrist for [employer] and gets her care from Teresa Mann at the [place]. Review of Systems: A complete review of systems was obtained from the patient.  I have reviewed this information and discussed as appropriate with the patient.  See HPI as well for other ROS.   Medical History: Past Medical History:  Diagnosis Date   Anxiety    Arthritis    Hypertension    Sleep apnea     There is no problem list on file for this patient.   History reviewed. No pertinent surgical history.   No Known Allergies  Current Outpatient Medications on File Prior to Visit  Medication Sig Dispense Refill   amLODIPine (NORVASC) 5 MG tablet Take 1 tablet by mouth once daily     celecoxib (CELEBREX) 200 MG capsule Take 1 capsule by mouth once daily     cholecalciferol (VITAMIN D3) 2,000 unit tablet Take 1 tablet by mouth once daily     No current facility-administered medications on file prior to visit.    Family History  Problem Relation Age of Onset   Obesity Mother    High blood pressure (Hypertension) Mother    Hyperlipidemia (Elevated cholesterol) Mother    Diabetes Mother    High blood pressure (Hypertension) Father    Hyperlipidemia (Elevated cholesterol) Father    Deep vein thrombosis (DVT or abnormal blood clot formation) Father    Obesity Sister    High blood pressure (Hypertension) Sister    High blood pressure (Hypertension) Brother      Social History   Tobacco Use  Smoking Status Never  Smokeless Tobacco Never     Social History   Socioeconomic History   Marital status: Divorced  Tobacco Use   Smoking status: Never   Smokeless tobacco: Never    Objective:    Vitals:   12/28/21 0859  BP: 138/86  Pulse: 106  Resp: 20  Temp: 37.1 C (98.7 F)  SpO2: 96%  Weight: (!) 121 kg (266 lb 12.8 oz)  Height: 155.6 cm (5' 1.26")     Body mass index is 49.98 kg/m.  Alert and well-appearing Unlabored respirations    Labs, Imaging and  Diagnostic Testing: Work from the [place] date 02/13/2021; CMP which is all normal,, hemoglobin A1c 6.1,  Assessment and Plan:  Diagnoses and all orders for this visit:  Morbid obesity (CMS-HCC)  Essential hypertension  Arthritis  Prediabetes  Sleep apnea, unspecified type  Other migraine without status migrainosus, not intractable  Anxiety     She remains a good candidate for bariatric surgery.  The patient is initially interested in gastric bypass because she is interested in the fact that it is reversible.  Given that she does not have significant diabetes or reflux, I do feel the sleeve would be a better option for her especially given her age and potential need for ongoing NSAID use for her arthritis.  We discussed the merits and trade-offs of both surgeries, and ultimately she would like to proceed with sleeve gastrectomy. We have previously discussed the surgery including technical aspects, the risks of bleeding, infection, pain, scarring, injury to intra-abdominal structures, staple line leak or abscess, chronic abdominal pain or nausea, new onset or worsened GERD, DVT/PE, pneumonia, heart attack, stroke, death, failure to reach weight loss goals and weight regain, hernia.  Discussed the typical peri-, and postoperative course.  Reiterated the importance of lifelong behavioral changes to combat the chronic and relapsing disease which is obesity.  Questions were welcomed and answered.  Plan to proceed with surgery next month as scheduled.  BMI 30= 160lb  Markell Schrier [name], MD

## 2022-01-01 ENCOUNTER — Encounter: Payer: Self-pay | Admitting: Skilled Nursing Facility1

## 2022-01-01 ENCOUNTER — Encounter: Payer: No Typology Code available for payment source | Attending: Surgery | Admitting: Skilled Nursing Facility1

## 2022-01-01 DIAGNOSIS — Z713 Dietary counseling and surveillance: Secondary | ICD-10-CM | POA: Diagnosis not present

## 2022-01-01 DIAGNOSIS — E669 Obesity, unspecified: Secondary | ICD-10-CM | POA: Insufficient documentation

## 2022-01-01 DIAGNOSIS — Z6841 Body Mass Index (BMI) 40.0 and over, adult: Secondary | ICD-10-CM | POA: Insufficient documentation

## 2022-01-01 NOTE — Progress Notes (Signed)
Pre-Operative Nutrition Class:    Patient was seen on 01/01/2022 for Pre-Operative Bariatric Surgery Education at [facility].    Surgery date: 01/23/2022 Surgery type: sleeve gastrectomy  Start weight at [facility]: 262.8 Weight today: 267  Samples given per MNT protocol. Patient educated on appropriate usage:  Bariatric Advantage Multivitamin Lot # [id number] Exp: 08/24   Bariatric Advantage Calcium  Lot # [id number] Exp: 07/12/2022   Protein Shake Lot # [id number] / [id number] Exp: 31 Dec 2021 /  02 Apr 2022    The following the learning objectives were met by the patient during this course: Identify Pre-Op Dietary Goals and will begin 2 weeks pre-operatively Identify appropriate sources of fluids and proteins  State protein recommendations and appropriate sources pre and post-operatively Identify Post-Operative Dietary Goals and will follow for 2 weeks post-operatively Identify appropriate multivitamin and calcium sources Describe the need for physical activity post-operatively and will follow MD recommendations State when to call healthcare provider regarding medication questions or post-operative complications When having a diagnosis of diabetes understanding hypoglycemia symptoms and the inclusion of 1 complex carbohydrate per meal  Handouts given during class include: Pre-Op Bariatric Surgery Diet Handout Protein Shake Handout Post-Op Bariatric Surgery Nutrition Handout BELT Program Information Flyer Support Group Information Flyer [facility] Bariatric Supplements Price List  Follow-Up Plan: Patient will follow-up at [facility] 2 weeks post operatively for diet advancement per MD.

## 2022-01-16 NOTE — Patient Instructions (Signed)
SURGICAL WAITING ROOM VISITATION Patients having surgery or a procedure may have no more than 2 support people in the waiting area - these visitors may rotate in the visitor waiting room.   Children under the age of 27 must have an adult with them who is not the patient. If the patient needs to stay at the hospital during part of their recovery, the visitor guidelines for inpatient rooms apply.  PRE-OP VISITATION  Pre-op nurse will coordinate an appropriate time for 1 support person to accompany the patient in pre-op.  This support person may not rotate.  This visitor will be contacted when the time is appropriate for the visitor to come back in the pre-op area.  Please refer to the [facility] website for the visitor guidelines for Inpatients (after your surgery is over and you are in a regular room).  You are not required to quarantine at this time prior to your surgery. However, you must do this: Hand Hygiene often Do NOT share personal items Notify your provider if you are in close contact with someone who has COVID or you develop fever 100.4 or greater, new onset of sneezing, cough, sore throat, shortness of breath or body aches.   If you received a COVID test during your pre-op visit  it is requested that you wear a mask when out in public, stay away from anyone that may not be feeling well and notify your surgeon if you develop symptoms. If you test positive for Covid or have been in contact with anyone that has tested positive in the last 10 days please notify you surgeon.       Your procedure is scheduled on:  Monday January 22, 2022  Report to [facility] Main Entrance.  Report to admitting at:  0800   AM  +++++Call this number if you have any questions or problems the morning of surgery [phone]  Do not eat food :After Midnight the night prior to your surgery/procedure.  After Midnight you may have the following liquids until   07:15  AM DAY OF SURGERY  Clear  Liquid Diet Water Black Coffee (sugar ok, NO MILK/CREAM OR CREAMERS)  Tea (sugar ok, NO MILK/CREAM OR CREAMERS) regular and decaf                             Plain Jell-O  with no fruit (NO RED)                                           Fruit ices (not with fruit pulp, NO RED)                                     Popsicles (NO RED)                                                                  Juice: apple, WHITE grape, WHITE cranberry Sports drinks like Gatorade or Powerade (NO RED)  The day of surgery:  Drink ONE (1) Pre-Surgery G2 at   07:15 AM the morning of surgery. Drink in one sitting. Do not sip.  This drink was given to you during your hospital pre-op appointment visit. Nothing else to drink after completing the Pre-Surgery G2 : No candy, chewing gum or throat lozenges.    FOLLOW BOWEL PREP AND ANY ADDITIONAL PRE OP INSTRUCTIONS YOU RECEIVED FROM YOUR SURGEON'S OFFICE!!!   Oral Hygiene is also important to reduce your risk of infection.        Remember - BRUSH YOUR TEETH THE MORNING OF SURGERY WITH YOUR REGULAR TOOTHPASTE  Do NOT smoke after Midnight the night before surgery.  Take ONLY these medicines the morning of surgery with A SIP OF WATER: sertraline (Zoloft), amlodipine.  If needed, you may take Tylenol and may use Albuterol Inhaler, Flonase spray  If You have been diagnosed with Sleep Apnea - Bring CPAP mask and tubing day of surgery. We will provide you with a CPAP machine on the day of your surgery.                   You may not have any metal on your body including hair pins, jewelry, and body piercing  Do not wear make-up, lotions, powders, perfumes or deodorant  Do not wear nail polish including gel and S&S, artificial / acrylic nails, or any other type of covering on natural nails including finger and toenails. If you have artificial nails, gel coating, etc., that needs to be removed by a nail salon, Please have this removed prior to  surgery. Not doing so may mean that your surgery could be cancelled or delayed if the Surgeon or anesthesia staff feels like they are unable to monitor you safely.   Do not shave 48 hours prior to surgery to avoid nicks in your skin which may contribute to postoperative infections.   Contacts, Hearing Aids, dentures or bridgework may not be worn into surgery.   DO NOT [place]. PHARMACY WILL DISPENSE MEDICATIONS LISTED ON YOUR MEDICATION LIST TO YOU DURING YOUR ADMISSION [place]!   Patients discharged on the day of surgery will not be allowed to drive home.  Someone NEEDS to stay with you for the first 24 hours after anesthesia.  Special Instructions: Bring a copy of your healthcare power of attorney and living will documents the day of surgery, if you wish to have them scanned into your  Medical Records- EPIC  Please read over the following fact sheets you were given: IF YOU HAVE QUESTIONS ABOUT YOUR PRE-OP INSTRUCTIONS, PLEASE CALL [phone]  ([place])   [place] - Preparing for Surgery Before surgery, you can play an important role.  Because skin is not sterile, your skin needs to be as free of germs as possible.  You can reduce the number of germs on your skin by washing with CHG (chlorahexidine gluconate) soap before surgery.  CHG is an antiseptic cleaner which kills germs and bonds with the skin to continue killing germs even after washing. Please DO NOT use if you have an allergy to CHG or antibacterial soaps.  If your skin becomes reddened/irritated stop using the CHG and inform your nurse when you arrive at Short Stay. Do not shave (including legs and underarms) for at least 48 hours prior to the first CHG shower.  You may shave your face/neck.  Please follow these instructions carefully:  1.  Shower with CHG Soap  the night before surgery and the  morning of surgery.  2.  If you choose to wash your hair, wash your hair first as  usual with your normal  shampoo.  3.  After you shampoo, rinse your hair and body thoroughly to remove the shampoo.                             4.  Use CHG as you would any other liquid soap.  You can apply chg directly to the skin and wash.  Gently with a scrungie or clean washcloth.  5.  Apply the CHG Soap to your body ONLY FROM THE NECK DOWN.   Do not use on face/ open                           Wound or open sores. Avoid contact with eyes, ears mouth and genitals (private parts).                       Wash face,  Genitals (private parts) with your normal soap.             6.  Wash thoroughly, paying special attention to the area where your  surgery  will be performed.  7.  Thoroughly rinse your body with warm water from the neck down.  8.  DO NOT shower/wash with your normal soap after using and rinsing off the CHG Soap.            9.  Pat yourself dry with a clean towel.            10.  Wear clean pajamas.            11.  Place clean sheets on your bed the night of your first shower and do not  sleep with pets.  ON THE DAY OF SURGERY : Do not apply any lotions/deodorants the morning of surgery.  Please wear clean clothes to the hospital/surgery center.    FAILURE TO FOLLOW THESE INSTRUCTIONS MAY RESULT IN THE CANCELLATION OF YOUR SURGERY  PATIENT SIGNATURE_________________________________  NURSE SIGNATURE__________________________________  ________________________________________________________________________        Teresa Mann    An incentive spirometer is a tool that can help keep your lungs clear and active. This tool measures how well you are filling your lungs with each breath. Taking long deep breaths may help reverse or decrease the chance of developing breathing (pulmonary) problems (especially infection) following: A long period of time when you are unable to move or be active. BEFORE THE PROCEDURE  If the spirometer includes an indicator to show your best  effort, your nurse or respiratory therapist will set it to a desired goal. If possible, sit up straight or lean slightly forward. Try not to slouch. Hold the incentive spirometer in an upright position. INSTRUCTIONS FOR USE  Sit on the edge of your bed if possible, or sit up as far as you can in bed or on a chair. Hold the incentive spirometer in an upright position. Breathe out normally. Place the mouthpiece in your mouth and seal your lips tightly around it. Breathe in slowly and as deeply as possible, raising the piston or the ball toward the top of the column. Hold your breath for 3-5 seconds or for as long as possible. Allow the piston or ball to fall to the bottom of the column. Remove the  mouthpiece from your mouth and breathe out normally. Rest for a few seconds and repeat Steps 1 through 7 at least 10 times every 1-2 hours when you are awake. Take your time and take a few normal breaths between deep breaths. The spirometer may include an indicator to show your best effort. Use the indicator as a goal to work toward during each repetition. After each set of 10 deep breaths, practice coughing to be sure your lungs are clear. If you have an incision (the cut made at the time of surgery), support your incision when coughing by placing a pillow or rolled up towels firmly against it. Once you are able to get out of bed, walk around indoors and cough well. You may stop using the incentive spirometer when instructed by your caregiver.  RISKS AND COMPLICATIONS Take your time so you do not get dizzy or light-headed. If you are in pain, you may need to take or ask for pain medication before doing incentive spirometry. It is harder to take a deep breath if you are having pain. AFTER USE Rest and breathe slowly and easily. It can be helpful to keep track of a log of your progress. Your caregiver can provide you with a simple table to help with this. If you are using the spirometer at home, follow  these instructions: [place] IF:  You are having difficultly using the spirometer. You have trouble using the spirometer as often as instructed. Your pain medication is not giving enough relief while using the spirometer. You develop fever of 100.5 F (38.1 C) or higher.                                                                                                    SEEK IMMEDIATE MEDICAL CARE IF:  You cough up bloody sputum that had not been present before. You develop fever of 102 F (38.9 C) or greater. You develop worsening pain at or near the incision site. MAKE SURE YOU:  Understand these instructions. Will watch your condition. Will get help right away if you are not doing well or get worse. Document Released: 07/30/2006 Document Revised: 06/11/2011 Document Reviewed: 09/30/2006 [facility] Patient Information 2014 [place], [place].

## 2022-01-16 NOTE — Progress Notes (Signed)
COVID Vaccine received:  '[]'$  No '[x]'$  Yes Date of any COVID positive Test in last 90 days:  None  [facility] - [facility] [place]        [facility] [place]  Cardiologist - none  Chest x-ray - 10-13-21  Epic EKG -  10-12-2021  Epic Stress Test - n/a ECHO -  Cardiac Cath -   Pacemaker/ICD device     '[x]'$  N/A Spinal Cord Stimulator:'[x]'$  No '[]'$  Yes      (Remind patient to bring remote DOS) Other Implants:   Bowel Prep - per Dr. Kae Heller  History of Sleep Apnea? '[]'$  No '[x]'$  Yes   Sleep Study Date:   CPAP used?- '[x]'$  No '[]'$  Yes  (Instruct to bring their mask & Tubing)  Does the patient monitor blood sugar? '[x]'$  No '[]'$  Yes  '[]'$  N/A Fasting Blood Sugar Ranges-  Checks Blood Sugar ___0__ times a day  Blood Thinner Instructions:  none Aspirin Instructions: Last Dose:  ERAS Protocol Ordered: '[]'$  No  '[x]'$  Yes PRE-SURGERY '[]'$  ENSURE  '[x]'$  G2   Comments: Patient's CBG at this PST appt was 82, she said that she felt fine. I gave the patient some peanut butter crackers and apple juice to drink. The patient said that she had not eaten anything since early this morning. At the end of our visit, Patient reported she had no signs of hypoglycemia; she lives here in [place] and will check her CBG when she gets home.   Activity level: Patient can not climb a flight of stairs without difficulty; '[x]'$  No CP but would have _SOB   Anesthesia review: Pre-DM, HTN, Migraines, PTSD  Patient denies shortness of breath, fever, cough and chest pain at PAT appointment.  Patient verbalized understanding and agreement to the Pre-Surgical Instructions that were given to them at this PAT appointment. Patient was also educated of the need to review these PAT instructions again prior to his/her surgery.I reviewed the appropriate phone numbers to call if they have any and questions or concerns.

## 2022-01-17 ENCOUNTER — Other Ambulatory Visit: Payer: Self-pay

## 2022-01-17 ENCOUNTER — Encounter (HOSPITAL_COMMUNITY)
Admission: RE | Admit: 2022-01-17 | Discharge: 2022-01-17 | Disposition: A | Discharge status: Home / Self Care | Payer: No Typology Code available for payment source | Source: Ambulatory Visit | Attending: Surgery | Admitting: Surgery

## 2022-01-17 ENCOUNTER — Encounter (HOSPITAL_COMMUNITY): Payer: Self-pay

## 2022-01-17 VITALS — BP 166/98 | HR 82 | Temp 98.6°F | Resp 22 | Ht 62.0 in | Wt 262.0 lb

## 2022-01-17 DIAGNOSIS — Z01812 Encounter for preprocedural laboratory examination: Secondary | ICD-10-CM | POA: Diagnosis present

## 2022-01-17 DIAGNOSIS — R7303 Prediabetes: Secondary | ICD-10-CM | POA: Insufficient documentation

## 2022-01-17 HISTORY — DX: Prediabetes: R73.03

## 2022-01-17 HISTORY — DX: Respiratory tuberculosis unspecified: A15.9

## 2022-01-17 HISTORY — DX: Cardiac murmur, unspecified: R01.1

## 2022-01-17 HISTORY — DX: Headache, unspecified: R51.9

## 2022-01-17 HISTORY — DX: Sleep apnea, unspecified: G47.30

## 2022-01-17 HISTORY — DX: Essential (primary) hypertension: I10

## 2022-01-17 LAB — COMPREHENSIVE METABOLIC PANEL
ALT: 23 U/L (ref 0–44)
AST: 21 U/L (ref 15–41)
Albumin: 3.7 g/dL (ref 3.5–5.0)
Alkaline Phosphatase: 115 U/L (ref 38–126)
Anion gap: 7 (ref 5–15)
BUN: 19 mg/dL (ref 6–20)
CO2: 26 mmol/L (ref 22–32)
Calcium: 9.4 mg/dL (ref 8.9–10.3)
Chloride: 109 mmol/L (ref 98–111)
Creatinine, Ser: 0.75 mg/dL (ref 0.44–1.00)
GFR, Estimated: >60 mL/min (ref >60)
Glucose, Bld: 90 mg/dL (ref 70–99)
Potassium: 3.7 mmol/L (ref 3.5–5.1)
Sodium: 142 mmol/L (ref 135–145)
Total Bilirubin: 0.5 mg/dL (ref 0.3–1.2)
Total Protein: 7.4 g/dL (ref 6.5–8.1)

## 2022-01-17 LAB — HEMOGLOBIN A1C
Hgb A1c MFr Bld: 6.1 % — ABNORMAL HIGH (ref 4.8–5.6)
Mean Plasma Glucose: 128.37 mg/dL

## 2022-01-17 LAB — CBC WITH DIFFERENTIAL/PLATELET
Abs Immature Granulocytes: 0.01 10*3/uL (ref 0.00–0.07)
Basophils Absolute: 0 10*3/uL (ref 0.0–0.1)
Basophils Relative: 1 %
Eosinophils Absolute: 0.2 10*3/uL (ref 0.0–0.5)
Eosinophils Relative: 3 %
HCT: 45.3 % (ref 36.0–46.0)
Hemoglobin: 14.2 g/dL (ref 12.0–15.0)
Immature Granulocytes: 0 %
Lymphocytes Relative: 35 %
Lymphs Abs: 2.3 10*3/uL (ref 0.7–4.0)
MCH: 26.8 pg (ref 26.0–34.0)
MCHC: 31.3 g/dL (ref 30.0–36.0)
MCV: 85.5 fL (ref 80.0–100.0)
Monocytes Absolute: 0.4 10*3/uL (ref 0.1–1.0)
Monocytes Relative: 7 %
Neutro Abs: 3.7 10*3/uL (ref 1.7–7.7)
Neutrophils Relative %: 54 %
Platelets: 179 10*3/uL (ref 150–400)
RBC: 5.3 MIL/uL — ABNORMAL HIGH (ref 3.87–5.11)
RDW: 15.4 % (ref 11.5–15.5)
WBC: 6.6 10*3/uL (ref 4.0–10.5)
nRBC: 0 % (ref 0.0–0.2)

## 2022-01-17 LAB — GLUCOSE, CAPILLARY: Glucose-Capillary: 82 mg/dL (ref 70–99)

## 2022-01-22 ENCOUNTER — Inpatient Hospital Stay (HOSPITAL_COMMUNITY)
Admission: RE | Admit: 2022-01-22 | Discharge: 2022-01-23 | DRG: 621 | Disposition: A | Discharge status: Home / Self Care | Payer: No Typology Code available for payment source | Attending: Surgery | Admitting: Surgery

## 2022-01-22 ENCOUNTER — Other Ambulatory Visit: Payer: Self-pay

## 2022-01-22 ENCOUNTER — Inpatient Hospital Stay (HOSPITAL_COMMUNITY): Payer: No Typology Code available for payment source | Admitting: Certified Registered Nurse Anesthetist

## 2022-01-22 ENCOUNTER — Encounter (HOSPITAL_COMMUNITY)
Admission: RE | Disposition: A | Discharge status: Home / Self Care | Payer: Self-pay | Source: Home / Self Care | Attending: Surgery

## 2022-01-22 ENCOUNTER — Encounter (HOSPITAL_COMMUNITY): Payer: Self-pay | Admitting: Surgery

## 2022-01-22 DIAGNOSIS — Z833 Family history of diabetes mellitus: Secondary | ICD-10-CM

## 2022-01-22 DIAGNOSIS — Z6841 Body Mass Index (BMI) 40.0 and over, adult: Secondary | ICD-10-CM | POA: Diagnosis not present

## 2022-01-22 DIAGNOSIS — G43909 Migraine, unspecified, not intractable, without status migrainosus: Secondary | ICD-10-CM | POA: Diagnosis present

## 2022-01-22 DIAGNOSIS — G4730 Sleep apnea, unspecified: Secondary | ICD-10-CM | POA: Diagnosis present

## 2022-01-22 DIAGNOSIS — I10 Essential (primary) hypertension: Secondary | ICD-10-CM | POA: Diagnosis present

## 2022-01-22 DIAGNOSIS — Z79899 Other long term (current) drug therapy: Secondary | ICD-10-CM | POA: Diagnosis not present

## 2022-01-22 DIAGNOSIS — K660 Peritoneal adhesions (postprocedural) (postinfection): Secondary | ICD-10-CM | POA: Diagnosis present

## 2022-01-22 DIAGNOSIS — F419 Anxiety disorder, unspecified: Secondary | ICD-10-CM

## 2022-01-22 DIAGNOSIS — F4310 Post-traumatic stress disorder, unspecified: Secondary | ICD-10-CM | POA: Diagnosis present

## 2022-01-22 DIAGNOSIS — R7303 Prediabetes: Secondary | ICD-10-CM | POA: Diagnosis present

## 2022-01-22 DIAGNOSIS — M1710 Unilateral primary osteoarthritis, unspecified knee: Secondary | ICD-10-CM | POA: Diagnosis present

## 2022-01-22 DIAGNOSIS — Z8249 Family history of ischemic heart disease and other diseases of the circulatory system: Secondary | ICD-10-CM

## 2022-01-22 HISTORY — PX: UPPER GI ENDOSCOPY: SHX6162

## 2022-01-22 HISTORY — PX: LAPAROSCOPIC GASTRIC SLEEVE RESECTION: SHX5895

## 2022-01-22 LAB — TYPE AND SCREEN
ABO/RH(D): AB POS
Antibody Screen: NEGATIVE

## 2022-01-22 LAB — ABO/RH: ABO/RH(D): AB POS

## 2022-01-22 SURGERY — GASTRECTOMY, SLEEVE, LAPAROSCOPIC
Anesthesia: General | Site: Esophagus

## 2022-01-22 MED ORDER — BUPIVACAINE-EPINEPHRINE 0.25% -1:200000 IJ SOLN
INTRAMUSCULAR | Status: DC | PRN
  Administered 2022-01-22: 30 mL

## 2022-01-22 MED ORDER — AMISULPRIDE (ANTIEMETIC) 5 MG/2ML IV SOLN: 10.0000 mg | Freq: Once | INTRAVENOUS | Status: DC | PRN

## 2022-01-22 MED ORDER — HYDRALAZINE HCL 20 MG/ML IJ SOLN: 10.0000 mg | INTRAMUSCULAR | Status: DC | PRN

## 2022-01-22 MED ORDER — OXYCODONE HCL 5 MG PO TABS: 5.0000 mg | ORAL_TABLET | Freq: Once | ORAL | Status: DC | PRN

## 2022-01-22 MED ORDER — METOPROLOL TARTRATE 5 MG/5ML IV SOLN: 5.0000 mg | Freq: Four times a day (QID) | INTRAVENOUS | Status: DC | PRN

## 2022-01-22 MED ORDER — TRAZODONE HCL 50 MG PO TABS: 50.0000 mg | ORAL_TABLET | Freq: Every evening | ORAL | Status: DC | PRN

## 2022-01-22 MED ORDER — SODIUM CHLORIDE 0.9 % IV SOLN: INTRAVENOUS | Status: DC

## 2022-01-22 MED ORDER — OXYCODONE HCL 5 MG/5ML PO SOLN: 5.0000 mg | Freq: Four times a day (QID) | ORAL | Status: DC | PRN

## 2022-01-22 MED ORDER — FENTANYL CITRATE PF 50 MCG/ML IJ SOSY
PREFILLED_SYRINGE | INTRAMUSCULAR | Status: AC
  Filled 2022-01-22: qty 1

## 2022-01-22 MED ORDER — NALOXONE HCL 0.4 MG/ML IJ SOLN
INTRAMUSCULAR | Status: AC
  Filled 2022-01-22: qty 1

## 2022-01-22 MED ORDER — SCOPOLAMINE 1 MG/3DAYS TD PT72
1.0000 | MEDICATED_PATCH | TRANSDERMAL | Status: DC
  Administered 2022-01-22: 1.5 mg via TRANSDERMAL
  Filled 2022-01-22: qty 1

## 2022-01-22 MED ORDER — LIDOCAINE 2% (20 MG/ML) 5 ML SYRINGE
INTRAMUSCULAR | Status: DC | PRN
  Administered 2022-01-22: 1.5 mg/kg/h via INTRAVENOUS
  Administered 2022-01-22: 40 mg via INTRAVENOUS

## 2022-01-22 MED ORDER — ONDANSETRON HCL 4 MG/2ML IJ SOLN
INTRAMUSCULAR | Status: AC
  Filled 2022-01-22: qty 2

## 2022-01-22 MED ORDER — SODIUM CHLORIDE 0.9 % IV SOLN
2.0000 g | INTRAVENOUS | Status: AC
  Administered 2022-01-22: 2 g via INTRAVENOUS
  Filled 2022-01-22: qty 2

## 2022-01-22 MED ORDER — PANTOPRAZOLE SODIUM 40 MG IV SOLR
40.0000 mg | Freq: Every day | INTRAVENOUS | Status: DC
  Administered 2022-01-22: 40 mg via INTRAVENOUS
  Filled 2022-01-22: qty 10

## 2022-01-22 MED ORDER — ACETAMINOPHEN 325 MG PO TABS: 325.0000 mg | ORAL_TABLET | ORAL | Status: DC | PRN

## 2022-01-22 MED ORDER — BUPIVACAINE-EPINEPHRINE (PF) 0.25% -1:200000 IJ SOLN
INTRAMUSCULAR | Status: AC
  Filled 2022-01-22: qty 30

## 2022-01-22 MED ORDER — LACTATED RINGERS IV SOLN: INTRAVENOUS | Status: DC

## 2022-01-22 MED ORDER — BUPIVACAINE LIPOSOME 1.3 % IJ SUSP: 20.0000 mL | Freq: Once | INTRAMUSCULAR | Status: DC

## 2022-01-22 MED ORDER — BUPIVACAINE LIPOSOME 1.3 % IJ SUSP
INTRAMUSCULAR | Status: DC | PRN
  Administered 2022-01-22: 20 mL

## 2022-01-22 MED ORDER — SUGAMMADEX SODIUM 500 MG/5ML IV SOLN
INTRAVENOUS | Status: AC
  Filled 2022-01-22: qty 5

## 2022-01-22 MED ORDER — ALBUTEROL SULFATE (2.5 MG/3ML) 0.083% IN NEBU: 3.0000 mL | INHALATION_SOLUTION | Freq: Four times a day (QID) | RESPIRATORY_TRACT | Status: DC | PRN

## 2022-01-22 MED ORDER — BUPIVACAINE LIPOSOME 1.3 % IJ SUSP
INTRAMUSCULAR | Status: AC
  Filled 2022-01-22: qty 20

## 2022-01-22 MED ORDER — ORAL CARE MOUTH RINSE: 15.0000 mL | Freq: Once | OROMUCOSAL | Status: AC

## 2022-01-22 MED ORDER — GABAPENTIN 100 MG PO CAPS
200.0000 mg | ORAL_CAPSULE | Freq: Two times a day (BID) | ORAL | Status: DC
  Administered 2022-01-22 – 2022-01-23 (×2): 200 mg via ORAL
  Filled 2022-01-22 (×2): qty 2

## 2022-01-22 MED ORDER — KETAMINE HCL 10 MG/ML IJ SOLN
INTRAMUSCULAR | Status: DC | PRN
  Administered 2022-01-22: 30 mg via INTRAVENOUS
  Administered 2022-01-22: 20 mg via INTRAVENOUS

## 2022-01-22 MED ORDER — APREPITANT 40 MG PO CAPS
40.0000 mg | ORAL_CAPSULE | ORAL | Status: AC
  Administered 2022-01-22: 40 mg via ORAL
  Filled 2022-01-22: qty 1

## 2022-01-22 MED ORDER — SUGAMMADEX SODIUM 200 MG/2ML IV SOLN
INTRAVENOUS | Status: DC | PRN
  Administered 2022-01-22: 500 mg via INTRAVENOUS

## 2022-01-22 MED ORDER — LIDOCAINE HCL (PF) 2 % IJ SOLN
INTRAMUSCULAR | Status: AC
  Filled 2022-01-22: qty 5

## 2022-01-22 MED ORDER — PHENYLEPHRINE 80 MCG/ML (10ML) SYRINGE FOR IV PUSH (FOR BLOOD PRESSURE SUPPORT)
PREFILLED_SYRINGE | INTRAVENOUS | Status: AC
  Filled 2022-01-22: qty 10

## 2022-01-22 MED ORDER — TRAMADOL HCL 50 MG PO TABS: 50.0000 mg | ORAL_TABLET | Freq: Four times a day (QID) | ORAL | Status: DC | PRN

## 2022-01-22 MED ORDER — 0.9 % SODIUM CHLORIDE (POUR BTL) OPTIME
TOPICAL | Status: DC | PRN
  Administered 2022-01-22: 1000 mL

## 2022-01-22 MED ORDER — GABAPENTIN 300 MG PO CAPS
300.0000 mg | ORAL_CAPSULE | ORAL | Status: AC
  Administered 2022-01-22: 300 mg via ORAL
  Filled 2022-01-22: qty 1

## 2022-01-22 MED ORDER — ACETAMINOPHEN 160 MG/5ML PO SOLN: 325.0000 mg | ORAL | Status: DC | PRN

## 2022-01-22 MED ORDER — MIDAZOLAM HCL 2 MG/2ML IJ SOLN
INTRAMUSCULAR | Status: AC
  Filled 2022-01-22: qty 2

## 2022-01-22 MED ORDER — ACETAMINOPHEN 500 MG PO TABS
1000.0000 mg | ORAL_TABLET | ORAL | Status: AC
  Administered 2022-01-22: 1000 mg via ORAL
  Filled 2022-01-22: qty 2

## 2022-01-22 MED ORDER — SIMETHICONE 80 MG PO CHEW: 80.0000 mg | CHEWABLE_TABLET | Freq: Four times a day (QID) | ORAL | Status: DC | PRN

## 2022-01-22 MED ORDER — LACTATED RINGERS IR SOLN
Status: DC | PRN
  Administered 2022-01-22: 1000 mL

## 2022-01-22 MED ORDER — EPHEDRINE SULFATE-NACL 50-0.9 MG/10ML-% IV SOSY
PREFILLED_SYRINGE | INTRAVENOUS | Status: DC | PRN
  Administered 2022-01-22 (×3): 5 mg via INTRAVENOUS

## 2022-01-22 MED ORDER — CHLORHEXIDINE GLUCONATE 4 % EX LIQD: 60.0000 mL | Freq: Once | CUTANEOUS | Status: DC

## 2022-01-22 MED ORDER — OXYCODONE HCL 5 MG/5ML PO SOLN: 5.0000 mg | Freq: Once | ORAL | Status: DC | PRN

## 2022-01-22 MED ORDER — METHOCARBAMOL 1000 MG/10ML IJ SOLN: 500.0000 mg | Freq: Four times a day (QID) | INTRAVENOUS | Status: DC | PRN

## 2022-01-22 MED ORDER — HEPARIN SODIUM (PORCINE) 5000 UNIT/ML IJ SOLN
5000.0000 [IU] | Freq: Three times a day (TID) | INTRAMUSCULAR | Status: DC
  Administered 2022-01-22 – 2022-01-23 (×2): 5000 [IU] via SUBCUTANEOUS
  Filled 2022-01-22 (×2): qty 1

## 2022-01-22 MED ORDER — ENSURE MAX PROTEIN PO LIQD
2.0000 [oz_av] | ORAL | Status: DC
  Administered 2022-01-23 (×3): 2 [oz_av] via ORAL

## 2022-01-22 MED ORDER — PROPOFOL 10 MG/ML IV BOLUS
INTRAVENOUS | Status: DC | PRN
  Administered 2022-01-22: 130 mg via INTRAVENOUS

## 2022-01-22 MED ORDER — SERTRALINE HCL 50 MG PO TABS
50.0000 mg | ORAL_TABLET | Freq: Every day | ORAL | Status: DC
  Administered 2022-01-22 – 2022-01-23 (×2): 50 mg via ORAL
  Filled 2022-01-22 (×2): qty 1

## 2022-01-22 MED ORDER — ACETAMINOPHEN 500 MG PO TABS
1000.0000 mg | ORAL_TABLET | Freq: Three times a day (TID) | ORAL | Status: DC
  Administered 2022-01-22 – 2022-01-23 (×3): 1000 mg via ORAL
  Filled 2022-01-22 (×3): qty 2

## 2022-01-22 MED ORDER — PROMETHAZINE HCL 25 MG/ML IJ SOLN: 6.2500 mg | INTRAMUSCULAR | Status: DC | PRN

## 2022-01-22 MED ORDER — PHENYLEPHRINE HCL-NACL 20-0.9 MG/250ML-% IV SOLN
INTRAVENOUS | Status: DC | PRN
  Administered 2022-01-22: 35 ug/min via INTRAVENOUS

## 2022-01-22 MED ORDER — ACETAMINOPHEN 10 MG/ML IV SOLN: 1000.0000 mg | Freq: Once | INTRAVENOUS | Status: DC | PRN

## 2022-01-22 MED ORDER — ONDANSETRON HCL 4 MG/2ML IJ SOLN
INTRAMUSCULAR | Status: DC | PRN
  Administered 2022-01-22: 4 mg via INTRAVENOUS

## 2022-01-22 MED ORDER — DOCUSATE SODIUM 100 MG PO CAPS
100.0000 mg | ORAL_CAPSULE | Freq: Two times a day (BID) | ORAL | Status: DC
  Administered 2022-01-22 – 2022-01-23 (×3): 100 mg via ORAL
  Filled 2022-01-22 (×3): qty 1

## 2022-01-22 MED ORDER — ROCURONIUM BROMIDE 10 MG/ML (PF) SYRINGE
PREFILLED_SYRINGE | INTRAVENOUS | Status: AC
  Filled 2022-01-22: qty 10

## 2022-01-22 MED ORDER — KETAMINE HCL 50 MG/5ML IJ SOSY
PREFILLED_SYRINGE | INTRAMUSCULAR | Status: AC
  Filled 2022-01-22: qty 5

## 2022-01-22 MED ORDER — PHENYLEPHRINE 80 MCG/ML (10ML) SYRINGE FOR IV PUSH (FOR BLOOD PRESSURE SUPPORT)
PREFILLED_SYRINGE | INTRAVENOUS | Status: DC | PRN
  Administered 2022-01-22 (×2): 80 ug via INTRAVENOUS
  Administered 2022-01-22: 160 ug via INTRAVENOUS

## 2022-01-22 MED ORDER — EPHEDRINE 5 MG/ML INJ
INTRAVENOUS | Status: AC
  Filled 2022-01-22: qty 5

## 2022-01-22 MED ORDER — ROCURONIUM BROMIDE 10 MG/ML (PF) SYRINGE
PREFILLED_SYRINGE | INTRAVENOUS | Status: DC | PRN
  Administered 2022-01-22: 40 mg via INTRAVENOUS
  Administered 2022-01-22: 60 mg via INTRAVENOUS

## 2022-01-22 MED ORDER — ONDANSETRON HCL 4 MG/2ML IJ SOLN: 4.0000 mg | INTRAMUSCULAR | Status: DC | PRN

## 2022-01-22 MED ORDER — HYDROMORPHONE HCL 1 MG/ML IJ SOLN: 0.5000 mg | INTRAMUSCULAR | Status: DC | PRN

## 2022-01-22 MED ORDER — HEPARIN SODIUM (PORCINE) 5000 UNIT/ML IJ SOLN
5000.0000 [IU] | INTRAMUSCULAR | Status: AC
  Administered 2022-01-22: 5000 [IU] via SUBCUTANEOUS
  Filled 2022-01-22: qty 1

## 2022-01-22 MED ORDER — CHLORHEXIDINE GLUCONATE 0.12 % MT SOLN
15.0000 mL | Freq: Once | OROMUCOSAL | Status: AC
  Administered 2022-01-22: 15 mL via OROMUCOSAL

## 2022-01-22 MED ORDER — PROPOFOL 10 MG/ML IV BOLUS
INTRAVENOUS | Status: AC
  Filled 2022-01-22: qty 20

## 2022-01-22 MED ORDER — DEXAMETHASONE SODIUM PHOSPHATE 10 MG/ML IJ SOLN
INTRAMUSCULAR | Status: DC | PRN
  Administered 2022-01-22: 10 mg via INTRAVENOUS

## 2022-01-22 MED ORDER — ACETAMINOPHEN 160 MG/5ML PO SOLN: 1000.0000 mg | Freq: Three times a day (TID) | ORAL | Status: DC

## 2022-01-22 MED ORDER — MIDAZOLAM HCL 5 MG/5ML IJ SOLN
INTRAMUSCULAR | Status: DC | PRN
  Administered 2022-01-22: 2 mg via INTRAVENOUS

## 2022-01-22 MED ORDER — NALOXONE HCL 0.4 MG/ML IJ SOLN
INTRAMUSCULAR | Status: DC | PRN
  Administered 2022-01-22: .08 mg via INTRAVENOUS

## 2022-01-22 MED ORDER — DEXAMETHASONE SODIUM PHOSPHATE 10 MG/ML IJ SOLN
INTRAMUSCULAR | Status: AC
  Filled 2022-01-22: qty 1

## 2022-01-22 MED ORDER — FENTANYL CITRATE (PF) 250 MCG/5ML IJ SOLN
INTRAMUSCULAR | Status: AC
  Filled 2022-01-22: qty 5

## 2022-01-22 MED ORDER — LIDOCAINE HCL (PF) 2 % IJ SOLN
INTRAMUSCULAR | Status: AC
  Filled 2022-01-22: qty 10

## 2022-01-22 MED ORDER — FENTANYL CITRATE PF 50 MCG/ML IJ SOSY
25.0000 ug | PREFILLED_SYRINGE | INTRAMUSCULAR | Status: DC | PRN
  Administered 2022-01-22: 25 ug via INTRAVENOUS

## 2022-01-22 MED ORDER — AMLODIPINE BESYLATE 10 MG PO TABS
10.0000 mg | ORAL_TABLET | Freq: Every day | ORAL | Status: DC
  Administered 2022-01-22 – 2022-01-23 (×2): 10 mg via ORAL
  Filled 2022-01-22 (×2): qty 1

## 2022-01-22 MED ORDER — FENTANYL CITRATE (PF) 250 MCG/5ML IJ SOLN
INTRAMUSCULAR | Status: DC | PRN
  Administered 2022-01-22: 100 ug via INTRAVENOUS
  Administered 2022-01-22: 50 ug via INTRAVENOUS

## 2022-01-22 MED ORDER — KETOROLAC TROMETHAMINE 15 MG/ML IJ SOLN: 15.0000 mg | Freq: Three times a day (TID) | INTRAMUSCULAR | Status: DC | PRN

## 2022-01-22 MED ORDER — METOCLOPRAMIDE HCL 5 MG/ML IJ SOLN
10.0000 mg | Freq: Four times a day (QID) | INTRAMUSCULAR | Status: DC
  Administered 2022-01-22 – 2022-01-23 (×4): 10 mg via INTRAVENOUS
  Filled 2022-01-22 (×4): qty 2

## 2022-01-22 SURGICAL SUPPLY — 69 items
APPLIER CLIP ROT 10 11.4 M/L (STAPLE)
APPLIER CLIP ROT 13.4 12 LRG (CLIP)
BAG COUNTER SPONGE SURGICOUNT (BAG) IMPLANT
BAG LAPAROSCOPIC 12 15 PORT 16 (BASKET) IMPLANT
BAG RETRIEVAL 12/15 (BASKET) ×2
BENZOIN TINCTURE PRP APPL 2/3 (GAUZE/BANDAGES/DRESSINGS) ×2 IMPLANT
BLADE SURG SZ11 CARB STEEL (BLADE) ×2 IMPLANT
BNDG ADH 1X3 SHEER STRL LF (GAUZE/BANDAGES/DRESSINGS) ×12 IMPLANT
CABLE HIGH FREQUENCY MONO STRZ (ELECTRODE) IMPLANT
CHLORAPREP W/TINT 26 (MISCELLANEOUS) ×4 IMPLANT
CLIP APPLIE ROT 10 11.4 M/L (STAPLE) IMPLANT
CLIP APPLIE ROT 13.4 12 LRG (CLIP) IMPLANT
COVER SURGICAL LIGHT HANDLE (MISCELLANEOUS) ×2 IMPLANT
DEVICE SUT QUICK LOAD TK 5 (SUTURE) IMPLANT
DEVICE SUT TI-KNOT TK 5X26 (SUTURE) IMPLANT
DEVICE SUTURE ENDOST 10MM (ENDOMECHANICALS) IMPLANT
DRAPE UTILITY XL STRL (DRAPES) ×4 IMPLANT
ELECT REM PT RETURN 15FT ADLT (MISCELLANEOUS) ×2 IMPLANT
GAUZE SPONGE 4X4 12PLY STRL (GAUZE/BANDAGES/DRESSINGS) IMPLANT
GLOVE BIO SURGEON STRL SZ 6 (GLOVE) ×2 IMPLANT
GLOVE INDICATOR 6.5 STRL GRN (GLOVE) ×2 IMPLANT
GLOVE SS BIOGEL STRL SZ 6 (GLOVE) ×2 IMPLANT
GOWN STRL REUS W/ TWL LRG LVL3 (GOWN DISPOSABLE) ×2 IMPLANT
GOWN STRL REUS W/ TWL XL LVL3 (GOWN DISPOSABLE) IMPLANT
GOWN STRL REUS W/TWL LRG LVL3 (GOWN DISPOSABLE) ×2
GOWN STRL REUS W/TWL XL LVL3 (GOWN DISPOSABLE)
GRASPER SUT TROCAR 14GX15 (MISCELLANEOUS) ×2 IMPLANT
IRRIG SUCT STRYKERFLOW 2 WTIP (MISCELLANEOUS) ×2
IRRIGATION SUCT STRKRFLW 2 WTP (MISCELLANEOUS) ×2 IMPLANT
KIT BASIN OR (CUSTOM PROCEDURE TRAY) ×2 IMPLANT
KIT TURNOVER KIT A (KITS) IMPLANT
MARKER SKIN DUAL TIP RULER LAB (MISCELLANEOUS) ×2 IMPLANT
MAT PREVALON FULL STRYKER (MISCELLANEOUS) ×2 IMPLANT
NDL SPNL 22GX3.5 QUINCKE BK (NEEDLE) ×2 IMPLANT
NEEDLE SPNL 22GX3.5 QUINCKE BK (NEEDLE) ×2 IMPLANT
PACK UNIVERSAL I (CUSTOM PROCEDURE TRAY) ×2 IMPLANT
RELOAD ENDO STITCH (ENDOMECHANICALS) IMPLANT
RELOAD STAPLE 60 3.6 BLU REG (STAPLE) ×2 IMPLANT
RELOAD STAPLE 60 3.8 GOLD REG (STAPLE) ×2 IMPLANT
RELOAD STAPLER BLUE 60MM (STAPLE) ×12 IMPLANT
RELOAD STAPLER GOLD 60MM (STAPLE) ×4 IMPLANT
RELOAD SUT TRIPLE-STITCH 2-0 (ENDOMECHANICALS) IMPLANT
SCISSORS LAP 5X45 EPIX DISP (ENDOMECHANICALS) ×2 IMPLANT
SET TUBE SMOKE EVAC HIGH FLOW (TUBING) ×2 IMPLANT
SHEARS HARMONIC ACE PLUS 45CM (MISCELLANEOUS) ×2 IMPLANT
SLEEVE ADV FIXATION 5X100MM (TROCAR) ×4 IMPLANT
SLEEVE GASTRECTOMY 40FR VISIGI (MISCELLANEOUS) ×2 IMPLANT
SOL ANTI FOG 6CC (MISCELLANEOUS) ×2 IMPLANT
SOLUTION ANTI FOG 6CC (MISCELLANEOUS) ×2
SPIKE FLUID TRANSFER (MISCELLANEOUS) ×2 IMPLANT
STAPLE LINE REINFORCEMENT LAP (STAPLE) IMPLANT
STAPLER ECHELON LONG 60 440 (INSTRUMENTS) ×2 IMPLANT
STAPLER RELOAD BLUE 60MM (STAPLE) ×12
STAPLER RELOAD GOLD 60MM (STAPLE) ×4
STRIP CLOSURE SKIN 1/2X4 (GAUZE/BANDAGES/DRESSINGS) ×2 IMPLANT
SUT MNCRL AB 4-0 PS2 18 (SUTURE) ×2 IMPLANT
SUT SURGIDAC NAB ES-9 0 48 120 (SUTURE) IMPLANT
SUT VICRYL 0 TIES 12 18 (SUTURE) ×2 IMPLANT
SYR 10ML ECCENTRIC (SYRINGE) ×2 IMPLANT
SYR 20ML LL LF (SYRINGE) ×2 IMPLANT
SYR 50ML LL SCALE MARK (SYRINGE) ×2 IMPLANT
SYS KII OPTICAL ACCESS 15MM (TROCAR) ×2
SYSTEM KII OPTICAL ACCESS 15MM (TROCAR) ×2 IMPLANT
TOWEL OR 17X26 10 PK STRL BLUE (TOWEL DISPOSABLE) ×2 IMPLANT
TOWEL OR NON WOVEN STRL DISP B (DISPOSABLE) ×2 IMPLANT
TROCAR ADV FIXATION 5X100MM (TROCAR) ×2 IMPLANT
TROCAR XCEL NON-BLD 5MMX100MML (ENDOMECHANICALS) ×2 IMPLANT
TUBING CONNECTING 10 (TUBING) ×2 IMPLANT
TUBING ENDO SMARTCAP (MISCELLANEOUS) ×2 IMPLANT

## 2022-01-22 NOTE — Transfer of Care (Signed)
Immediate Anesthesia Transfer of Care Note  Patient: Teresa Mann  Procedure(s) Performed: LAPAROSCOPIC SLEEVE GASTRECTOMY (Abdomen) UPPER GI ENDOSCOPY (Esophagus)  Patient Location: PACU  Anesthesia Type:General  Level of Consciousness: awake, alert  and oriented  Airway & Oxygen Therapy: Patient Spontanous Breathing and Patient connected to face mask oxygen  Post-op Assessment: Report given to RN and Post -op Vital signs reviewed and stable  Post vital signs: Reviewed and stable  Last Vitals:  Vitals Value Taken Time  BP 158/86 01/22/22 0930  Temp 36.3 C 01/22/22 0930  Pulse 93 01/22/22 0934  Resp 19 01/22/22 0934  SpO2 96 % 01/22/22 0934  Vitals shown include unvalidated device data.  Last Pain:  Vitals:   01/22/22 0930  TempSrc:   PainSc: Asleep      Patients Stated Pain Goal: 0 (08/10/00 1117)  Complications: No notable events documented.

## 2022-01-22 NOTE — Op Note (Signed)
Teresa Mann [id number] May 17, 1960 01/22/2022  Preoperative diagnosis: severe obesity  Postoperative diagnosis: Same   Procedure: upper endoscopy   Surgeon: Leighton Ruff. Noelani Harbach M.D., FACS   Anesthesia: Gen.   Indications for procedure: 61 y.o. year old female undergoing Laparoscopic Gastric Sleeve Resection and an EGD was requested to evaluate the new gastric sleeve.   Description of procedure: After we have completed the sleeve resection, I scrubbed out and obtained the Olympus endoscope. I gently placed endoscope in the patient's oropharynx and gently glided it down the esophagus without any difficulty under direct visualization. Once I was in the gastric sleeve, I insufflated the stomach with air. I was able to cannulate and advanced the scope through the gastric sleeve. I was able to cannulate the duodenum with ease. Dr. Kae Heller had placed saline in the upper abdomen. Upon further insufflation of the gastric sleeve there was no evidence of bubbles. Z line located at 35 cm and was regular. Upon further inspection of the gastric sleeve, the mucosa appeared normal. There is no evidence of any mucosal abnormality. The sleeve was widely patent at the angularis. There was no evidence of bleeding. The gastric sleeve was decompressed. The scope was withdrawn. The patient tolerated this portion of the procedure well. Please see Dr Ron Parker operative note for details regarding the laparoscopic gastric sleeve resection.   Leighton Ruff. Redmond Pulling, MD, FACS  General, Bariatric, & Minimally Invasive Surgery  [facility], [place]

## 2022-01-22 NOTE — Interval H&P Note (Signed)
History and Physical Interval Note:  01/22/2022 7:05 AM  Teresa Mann  has presented today for surgery, with the diagnosis of MORBID OBESITY.  The various methods of treatment have been discussed with the patient and family. After consideration of risks, benefits and other options for treatment, the patient has consented to  Procedure(s): LAPAROSCOPIC SLEEVE GASTRECTOMY (N/A) UPPER GI ENDOSCOPY (N/A) as a surgical intervention.  The patient's history has been reviewed, patient examined, no change in status, stable for surgery.  I have reviewed the patient's chart and labs.  Questions were answered to the patient's satisfaction.     Dema Timmons Rich Brave

## 2022-01-22 NOTE — Progress Notes (Signed)
PHARMACY CONSULT FOR:  Risk Assessment for Post-Discharge VTE Following Bariatric Surgery  Post-Discharge VTE Risk Assessment: This patient's probability of 30-day post-discharge VTE is increased due to the factors marked: X Sleeve gastrectomy   Liver disorder (transplant, cirrhosis, or nonalcoholic steatohepatitis)   Hx of VTE   Hemorrhage requiring transfusion   GI perforation, leak, or obstruction   ====================================================    Female  X  Age >/=60 years    BMI >/=50 kg/m2    CHF    Dyspnea at Rest    Paraplegia  X  Non-gastric-band surgery    Operation Time >/=3 hr    Return to OR     Length of Stay >/= 3 d   Hypercoagulable condition   Significant venous stasis      Predicted probability of 30-day post-discharge VTE: 0.31%  Other patient-specific factors to consider: no  Recommendation for Discharge: No pharmacologic prophylaxis post-discharge  Teresa Mann is a 61 y.o. female who underwent laparoscopic sleeve gastrectomy 01/22/2022   No Known Allergies  Patient Measurements: Height: '5\' 2"'$  (157.5 cm) Weight: 118.9 kg (262 lb 3.2 oz) IBW/kg (Calculated) : 50.1 Body mass index is 47.96 kg/m.  No results for input(s): "WBC", "HGB", "HCT", "PLT", "APTT", "CREATININE", "LABCREA", "CREAT24HRUR", "MG", "PHOS", "ALBUMIN", "PROT", "AST", "ALT", "ALKPHOS", "BILITOT", "BILIDIR", "IBILI" in the last 72 hours. Estimated Creatinine Clearance: 91.6 mL/min (by C-G formula based on SCr of 0.75 mg/dL).    Past Medical History:  Diagnosis Date   Arthritis of knee    Endometriosis    Fibroids    Headache    migraines   Heart murmur    Hypertension    Ovarian cyst    Pre-diabetes    PTSD (post-traumatic stress disorder)    Sleep apnea    Tuberculosis    false positive for TB,     Medications Prior to Admission  Medication Sig Dispense Refill Last Dose   amLODipine (NORVASC) 5 MG tablet Take 10 mg by mouth daily.   01/21/2022    celecoxib (CELEBREX) 200 MG capsule Take 200 mg by mouth daily.   Past Week   Cholecalciferol 50 MCG (2000 UT) TABS Take 2,000 Units by mouth daily.   01/21/2022   cyclobenzaprine (FLEXERIL) 10 MG tablet Take 1 tablet (10 mg total) by mouth at bedtime. (Patient taking differently: Take 10 mg by mouth 3 (three) times daily as needed for muscle spasms.) 10 tablet 0 Past Month   fluticasone (FLONASE) 50 MCG/ACT nasal spray Place 1 spray into both nostrils daily as needed for congestion.   Past Month   OVER THE COUNTER MEDICATION Apply 1 Application topically daily as needed (pain). Pain Cream   Past Month   sertraline (ZOLOFT) 50 MG tablet Take 50 mg by mouth daily.   01/21/2022   traZODone (DESYREL) 100 MG tablet Take 50 mg by mouth at bedtime as needed for sleep.   01/21/2022   acetaminophen (TYLENOL) 500 MG tablet Take 1,000 mg by mouth every 6 (six) hours as needed for mild pain.   More than a month   albuterol (PROVENTIL HFA;VENTOLIN HFA) 108 (90 Base) MCG/ACT inhaler Inhale 1-2 puffs into the lungs every 6 (six) hours as needed for wheezing or shortness of breath. 1 Inhaler 0 More than a month   ibuprofen (ADVIL,MOTRIN) 600 MG tablet Take 1 tablet (600 mg total) by mouth every 6 (six) hours as needed. (Patient taking differently: Take 800 mg by mouth every 6 (six) hours as needed for mild pain, moderate  pain or headache.) 30 tablet 0 More than a month   loratadine (CLARITIN) 10 MG tablet Take 10 mg by mouth daily as needed for allergies.   More than a month    Eudelia Bunch, Pharm.D 01/22/2022 11:45 AM

## 2022-01-22 NOTE — Anesthesia Procedure Notes (Signed)
Procedure Name: Intubation Date/Time: 01/22/2022 7:45 AM  Performed by: Maxwell Caul, CRNAPre-anesthesia Checklist: Patient identified, Emergency Drugs available, Suction available and Patient being monitored Patient Re-evaluated:Patient Re-evaluated prior to induction Oxygen Delivery Method: Circle system utilized Preoxygenation: Pre-oxygenation with 100% oxygen Induction Type: IV induction Ventilation: Mask ventilation without difficulty Laryngoscope Size: Mac and 4 Grade View: Grade I Tube type: Oral Tube size: 7.5 mm Number of attempts: 1 Airway Equipment and Method: Stylet and Oral airway Placement Confirmation: ETT inserted through vocal cords under direct vision, positive ETCO2 and breath sounds checked- equal and bilateral Secured at: 22 cm Tube secured with: Tape Dental Injury: Teeth and Oropharynx as per pre-operative assessment

## 2022-01-22 NOTE — TOC Progression Note (Signed)
Transition of Care [facility]) - Progression Note    Patient Details  Name: Teresa Mann MRN: [id number] Date of Birth: June 21, 1960  Transition of Care [facility]) CM/SW [place], RN Phone [phone]  01/22/2022, 3:15 PM  Clinical Narrative:     Transition of Care [facility]) Screening Note   Patient Details  Name: Teresa Mann Date of Birth: [birth year]   Transition of Care [facility]) CM/SW Contact:    Angelita Ingles, RN Phone Number: 01/22/2022, 3:15 PM    Transition of Care Department [facility]) has reviewed patient and no TOC needs have been identified at this time. We will continue to monitor patient advancement through interdisciplinary progression rounds. If new patient transition needs arise, please place a TOC consult.          Expected Discharge Plan and Services                                                 Social Determinants of Health (SDOH) Interventions Food Insecurity Interventions: Patient Refused Housing Interventions: Patient Refused  Readmission Risk Interventions     No data to display

## 2022-01-22 NOTE — Anesthesia Preprocedure Evaluation (Addendum)
Anesthesia Evaluation  Patient identified by MRN, date of birth, ID band Patient awake    Reviewed: Allergy & Precautions, NPO status , Patient's Chart, lab work & pertinent test results  Airway Mallampati: III   Neck ROM: Full    Dental  (+) Teeth Intact, Dental Advisory Given   Pulmonary sleep apnea ,    breath sounds clear to auscultation       Cardiovascular hypertension, Pt. on medications  Rhythm:Regular Rate:Normal     Neuro/Psych  Headaches, PSYCHIATRIC DISORDERS Anxiety    GI/Hepatic negative GI ROS, Neg liver ROS,   Endo/Other  negative endocrine ROS  Renal/GU negative Renal ROS     Musculoskeletal  (+) Arthritis ,   Abdominal Normal abdominal exam  (+)   Peds  Hematology negative hematology ROS (+)   Anesthesia Other Findings   Reproductive/Obstetrics                            Anesthesia Physical Anesthesia Plan  ASA: 3  Anesthesia Plan: General   Post-op Pain Management:    Induction: Intravenous  PONV Risk Score and Plan: 4 or greater and Ondansetron, Dexamethasone, Midazolam and Scopolamine patch - Pre-op  Airway Management Planned: Oral ETT  Additional Equipment: None  Intra-op Plan:   Post-operative Plan: Extubation in OR  Informed Consent: I have reviewed the patients History and Physical, chart, labs and discussed the procedure including the risks, benefits and alternatives for the proposed anesthesia with the patient or authorized representative who has indicated his/her understanding and acceptance.     Dental advisory given  Plan Discussed with: CRNA  Anesthesia Plan Comments:        Anesthesia Quick Evaluation

## 2022-01-22 NOTE — Op Note (Signed)
Operative Note  Teresa Mann  [id number]  [id number]  01/22/2022   Surgeon: Romana Juniper MD FACS   Assistant: Greer Pickerel MD FACS   Procedure performed: laparoscopic sleeve gastrectomy, hiatal hernia repair, upper endoscopy   Preop diagnosis: Morbid obesity Body mass index is 47.96 kg/m. Post-op diagnosis/intraop findings: same   Specimens: fundus Retained items: none  EBL: minimal  Complications: none   Description of procedure: After obtaining informed consent and administration of chemical DVT prophylaxis in holding, the patient was taken to the operating room and placed supine on operating room table where general endotracheal anesthesia was initiated, preoperative antibiotics were administered, SCDs applied, and a formal timeout was performed. The abdomen was prepped and draped in usual sterile fashion. Peritoneal access was gained using a Visiport technique in the left upper quadrant and insufflation to 15 mmHg ensued without issue. Gross inspection revealed no evidence of injury. Under direct visualization three more 5 mm trochars were placed in the right and left hemiabdomen and the 24m trocar in the right paramedian upper abdomen. Bilateral laparoscopic assisted TAPS blocks were performed with Exparel diluted with 0.25 percent Marcaine with epinephrine. The patient was placed in steep reverseTrendelenburg and the liver retractor was introduced through an incision in the upper midline and secured to the post externally to maintain the left lobe retracted anteriorly.  She did have a thin band of omental adhesions to her umbilical region which was taken down with the harmonic scalpel. On direct inspection of the hiatus there appeared to be hiatal hernia. The pars lucida was entered with Harmonic scalpel and the posterior aspect of the right and left crus were dissected out using Harmonic and blunt dissection. The hiatus was narrowed with a single posterior suture of 0 Ethibond  secured with the ty-knot device.   Using the Harmonic scalpel, the greater curvature of the stomach was dissected away from the greater omentum and short gastric vessels were divided. This began 6 cm from the pylorus, and dissection proceeded until the left crus was clearly exposed. There were some filmy adhesions of the posterior stomach to the retroperitoneum which were divided with the Harmonic. The 430[place]iGi was then introduced and directed down towards the pylorus. This was placed to suction against the lesser curve. Serial fires of the linear cutting stapler with staple line reinforcements were then employed to create our sleeve. The first fire used a gold load and ensured adequate room at the angularis incisura. Several blue loads were then employed to create an evenly tubular stomach preserving 1 cm lateral to the angle of His. The excised stomach was then removed through our 15 mm trocar site within an Endo Catch bag.  The visigi was taken off of suction and a few puffs of air were introduced, inflating the sleeve. No bubbles were observed in the irrigation fluid around the stomach and the shape was noted to be evenly tubular without any narrowing at the angularis. The visigi was then removed. Upper endoscopy was performed by the assistant surgeon and the sleeve was noted to be airtight, the staple line was hemostatic. Please see his separate note. The endoscope was removed. A small amount of oozing on the staple line was addressed with clips. The 15 mm trocar site fascia in the right upper abdomen was closed with a 0 Vicryl using the laparoscopic suture passer under direct visualization. The liver retractor was removed under direct visualization. The abdomen was then desufflated and all remaining trochars removed. The skin incisions were  closed with subcuticular 4-0 Monocryl; benzoin, Steri-Strips and Band-Aids were applied The patient was then awakened, extubated and taken to PACU in stable  condition.     All counts were correct at the completion of the case.

## 2022-01-23 ENCOUNTER — Encounter (HOSPITAL_COMMUNITY): Payer: Self-pay | Admitting: Surgery

## 2022-01-23 LAB — COMPREHENSIVE METABOLIC PANEL
ALT: 30 U/L (ref 0–44)
AST: 26 U/L (ref 15–41)
Albumin: 3.2 g/dL — ABNORMAL LOW (ref 3.5–5.0)
Alkaline Phosphatase: 101 U/L (ref 38–126)
Anion gap: 8 (ref 5–15)
BUN: 8 mg/dL (ref 6–20)
CO2: 25 mmol/L (ref 22–32)
Calcium: 8.4 mg/dL — ABNORMAL LOW (ref 8.9–10.3)
Chloride: 108 mmol/L (ref 98–111)
Creatinine, Ser: 0.65 mg/dL (ref 0.44–1.00)
GFR, Estimated: >60 mL/min (ref >60)
Glucose, Bld: 109 mg/dL — ABNORMAL HIGH (ref 70–99)
Potassium: 3.7 mmol/L (ref 3.5–5.1)
Sodium: 141 mmol/L (ref 135–145)
Total Bilirubin: 0.4 mg/dL (ref 0.3–1.2)
Total Protein: 6.9 g/dL (ref 6.5–8.1)

## 2022-01-23 LAB — CBC
HCT: 42 % (ref 36.0–46.0)
Hemoglobin: 13.3 g/dL (ref 12.0–15.0)
MCH: 27 pg (ref 26.0–34.0)
MCHC: 31.7 g/dL (ref 30.0–36.0)
MCV: 85.2 fL (ref 80.0–100.0)
Platelets: 191 10*3/uL (ref 150–400)
RBC: 4.93 MIL/uL (ref 3.87–5.11)
RDW: 15.7 % — ABNORMAL HIGH (ref 11.5–15.5)
WBC: 13.1 10*3/uL — ABNORMAL HIGH (ref 4.0–10.5)
nRBC: 0 % (ref 0.0–0.2)

## 2022-01-23 LAB — SURGICAL PATHOLOGY

## 2022-01-23 LAB — MAGNESIUM: Magnesium: 2 mg/dL (ref 1.7–2.4)

## 2022-01-23 MED ORDER — GABAPENTIN 100 MG PO CAPS: 200.0000 mg | ORAL_CAPSULE | Freq: Two times a day (BID) | ORAL | 0 refills | Status: AC

## 2022-01-23 MED ORDER — ONDANSETRON 4 MG PO TBDP: 4.0000 mg | ORAL_TABLET | Freq: Four times a day (QID) | ORAL | 0 refills | Status: AC | PRN

## 2022-01-23 MED ORDER — DOCUSATE SODIUM 100 MG PO CAPS: 100.0000 mg | ORAL_CAPSULE | Freq: Two times a day (BID) | ORAL | 0 refills | Status: AC

## 2022-01-23 MED ORDER — PANTOPRAZOLE SODIUM 40 MG PO TBEC: 40.0000 mg | DELAYED_RELEASE_TABLET | Freq: Every day | ORAL | 0 refills | Status: AC

## 2022-01-23 MED ORDER — TRAMADOL HCL 50 MG PO TABS: 50.0000 mg | ORAL_TABLET | Freq: Four times a day (QID) | ORAL | 0 refills | Status: AC | PRN

## 2022-01-23 NOTE — Anesthesia Postprocedure Evaluation (Signed)
Anesthesia Post Note  Patient: Teresa Mann  Procedure(s) Performed: LAPAROSCOPIC SLEEVE GASTRECTOMY (Abdomen) UPPER GI ENDOSCOPY (Esophagus)     Patient location during evaluation: PACU Anesthesia Type: General Level of consciousness: awake and alert Pain management: pain level controlled Vital Signs Assessment: post-procedure vital signs reviewed and stable Respiratory status: spontaneous breathing, nonlabored ventilation, respiratory function stable and patient connected to nasal cannula oxygen Cardiovascular status: blood pressure returned to baseline and stable Postop Assessment: no apparent nausea or vomiting Anesthetic complications: no                Effie Berkshire

## 2022-01-23 NOTE — Progress Notes (Signed)
Patient alert and oriented, pain is controlled. Patient is tolerating fluids, advanced to protein shake today, patient is tolerating well.  Reviewed Gastric sleeve discharge instructions with patient and patient is able to articulate understanding.  Provided information on BELT program, Support Group and [facility] outpatient pharmacy. All questions answered, will continue to monitor.  

## 2022-01-23 NOTE — Discharge Summary (Signed)
Physician Discharge Summary  Teresa Mann IFB:[id number] DOB: 1960/12/09 DOA: 01/22/2022  PCP: [facility]  Admit date: 01/22/2022 Discharge date: 01/23/2022  Recommendations for Outpatient Follow-up:    Follow-up Information     Clovis Riley, MD. Go on 02/16/2022.   Specialty: General Surgery Why: at 11:30am. Please arrive 15 minutes prior to your appointment time.  Thank you. Contact information: [address] [place] [address] [phone]         Clovis Riley, MD. Go on 03/15/2022.   Specialty: General Surgery Why: at 11:30am. Please arrive 15 minutes prior to your appointment time. Thank you. Contact information: [address] [place] [address] [phone]                Discharge Diagnoses:  Principal Problem:   Morbid obesity ([place])   Surgical Procedure: Laparoscopic Sleeve Gastrectomy, upper endoscopy  Discharge Condition: Good Disposition: Home  Diet recommendation: Postoperative sleeve gastrectomy diet (liquids only)  Filed Weights   01/22/22 0656  Weight: 118.9 kg     Hospital Course:  The patient was admitted for a planned laparoscopic sleeve gastrectomy. Please see operative note. Preoperatively the patient was given 5000 units of subcutaneous heparin for DVT prophylaxis. Postoperative prophylactic heparin dosing was started on the evening of postoperative day 0. ERAS protocol was used. On the evening of postoperative day 0, the patient was started on water and ice chips. On postoperative day 1 the patient had no fever or tachycardia and was tolerating water in their diet was gradually advanced throughout the day. The patient was ambulating without difficulty. Their vital signs are stable without fever or tachycardia. Their hemoglobin had remained stable. The patient had received discharge instructions and counseling. They were deemed stable for discharge and had met discharge  criteria   Discharge Instructions   Allergies as of 01/23/2022   No Known Allergies      Medication List     STOP taking these medications    celecoxib 200 MG capsule Commonly known as: CELEBREX   ibuprofen 600 MG tablet Commonly known as: ADVIL       TAKE these medications    acetaminophen 500 MG tablet Commonly known as: TYLENOL Take 1,000 mg by mouth every 6 (six) hours as needed for mild pain.   albuterol 108 (90 Base) MCG/ACT inhaler Commonly known as: VENTOLIN HFA Inhale 1-2 puffs into the lungs every 6 (six) hours as needed for wheezing or shortness of breath.   amLODipine 5 MG tablet Commonly known as: NORVASC Take 10 mg by mouth daily. Notes to patient: Monitor Blood Pressure Daily and keep a log for primary care physician.  You may need to make changes to your medications with rapid weight loss.     Cholecalciferol 50 MCG (2000 UT) Tabs Take 2,000 Units by mouth daily.   cyclobenzaprine 10 MG tablet Commonly known as: FLEXERIL Take 1 tablet (10 mg total) by mouth at bedtime. What changed:  when to take this reasons to take this   docusate sodium 100 MG capsule Commonly known as: Colace Take 1 capsule (100 mg total) by mouth 2 (two) times daily. Okay to decrease to once daily or stop taking if having loose bowel movements   fluticasone 50 MCG/ACT nasal spray Commonly known as: FLONASE Place 1 spray into both nostrils daily as needed for congestion.   gabapentin 100 MG capsule Commonly known as: NEURONTIN Take 2 capsules (200 mg total) by mouth every 12 (  twelve) hours.   loratadine 10 MG tablet Commonly known as: CLARITIN Take 10 mg by mouth daily as needed for allergies.   ondansetron 4 MG disintegrating tablet Commonly known as: ZOFRAN-ODT Take 1 tablet (4 mg total) by mouth every 6 (six) hours as needed for nausea or vomiting.   OVER THE COUNTER MEDICATION Apply 1 Application topically daily as needed (pain). Pain Cream    pantoprazole 40 MG tablet Commonly known as: PROTONIX Take 1 tablet (40 mg total) by mouth daily.   sertraline 50 MG tablet Commonly known as: ZOLOFT Take 50 mg by mouth daily.   traMADol 50 MG tablet Commonly known as: ULTRAM Take 1 tablet (50 mg total) by mouth every 6 (six) hours as needed (pain).   traZODone 100 MG tablet Commonly known as: DESYREL Take 50 mg by mouth at bedtime as needed for sleep.        Follow-up Information     Clovis Riley, MD. Go on 02/16/2022.   Specialty: General Surgery Why: at 11:30am. Please arrive 15 minutes prior to your appointment time.  Thank you. Contact information: [address] [place] [address] [phone]         Clovis Riley, MD. Go on 03/15/2022.   Specialty: General Surgery Why: at 11:30am. Please arrive 15 minutes prior to your appointment time. Thank you. Contact information: [address] [place] [address] [phone]                  The results of significant diagnostics from this hospitalization (including imaging, microbiology, ancillary and laboratory) are listed below for reference.    Significant Diagnostic Studies: No results found.  Labs: Basic Metabolic Panel: Recent Labs  Lab 01/17/22 1432 01/23/22 0441  NA 142 141  K 3.7 3.7  CL 109 108  CO2 26 25  GLUCOSE 90 109*  BUN 19 8  CREATININE 0.75 0.65  CALCIUM 9.4 8.4*  MG  --  2.0   Liver Function Tests: Recent Labs  Lab 01/17/22 1432 01/23/22 0441  AST 21 26  ALT 23 30  ALKPHOS 115 101  BILITOT 0.5 0.4  PROT 7.4 6.9  ALBUMIN 3.7 3.2*    CBC: Recent Labs  Lab 01/17/22 1432 01/23/22 0441  WBC 6.6 13.1*  NEUTROABS 3.7  --   HGB 14.2 13.3  HCT 45.3 42.0  MCV 85.5 85.2  PLT 179 191    CBG: Recent Labs  Lab 01/17/22 1420  GLUCAP 82    Principal Problem:   Morbid obesity ([place])    Signed:  [facility],  [place] 01/23/2022, 3:46 PM

## 2022-01-23 NOTE — Progress Notes (Signed)
S: Doing well, got some good sleep last night.  She has walked several times in the halls.  Noting some lower abdominal discomfort.  Some nausea yesterday but resolved now.  Tolerating liquids without any issues.  O: Vitals, labs, intake/output, and orders reviewed at this time. Afebrile, HR 88-109, moderately hypertensive SBO 150s, sats 94% RA. PO 480. UOP 2700 + 1 occurrence. Cmp- unremarkable; CBC- WBC 13.1 (6.6 preop), hgb 13.3 (14.2), plt 191 (179)   Gen: A&Ox3, no distress  H&N: EOMI, atraumatic, neck supple Chest: unlabored respirations, RRR Abd: soft, nontender, nondistended, incision(s) c/d/i no cellulitis or hematoma Ext: warm, no edema Neuro: grossly normal  Lines/tubes/drains: PIV  A/P: POD 1 s/p sleeve gastrectomy with hiatal hernia repair, doing well -Continue clears and protein shakes -Continue SCDs while in bed, SQH, ambulate frequently and aggressive pulm toilet -Anticipate DC later today   Romana Juniper, MD [facility], [place]

## 2022-01-23 NOTE — Progress Notes (Signed)
Discharge instructions discussed with patient, verbalized agreement and understanding 

## 2022-01-23 NOTE — Discharge Instructions (Signed)

## 2022-01-25 ENCOUNTER — Telehealth (HOSPITAL_COMMUNITY): Payer: Self-pay | Admitting: *Deleted

## 2022-01-25 NOTE — Telephone Encounter (Signed)
1.  Tell me about your pain and pain management? Pt denies any pain.  2.  Let's talk about fluid intake.  How much total fluid are you taking in? Pt states that she is getting in at least 100oz of fluid including protein shakes, bottled water, and yogurt.  3.  How much protein have you taken in the last 2 days? Pt states she is meeting her goal of 60g of protein each day with the protein shakes.  4.  Have you had nausea?  Tell me about when have experienced nausea and what you did to help? Pt denies nausea.   5.  Has the frequency or color changed with your urine? Pt states that she is urinating "alot" with no changes in frequency or urgency.     6.  Tell me what your incisions look like? "Incisions look fine". Pt denies a fever, chills.  Pt states incisions are not swollen, open, or draining.  Pt encouraged to call [facility] if incisions change.   7.  Have you been passing gas? BM? Pt states that she is having BMs. Last BM 01/25/22.     8.  If a problem or question were to arise who would you call?  Do you know contact numbers for [facility], [facility], and [facility]? Pt denies dehydration symptoms.  Pt can describe s/sx of dehydration.  Pt knows to call [facility] for surgical, [facility] for nutrition, and [facility] for non-urgent questions or concerns.   9.  How has the walking going? Pt states she is walking around and able to be active without difficulty.   10. Are you still using your incentive spirometer?  If so, how often? Pt states that she is not doing the I.S., but states that she has a "little cough and coughed up some phlegm". Instructed pt to use incentive spirometer, at least 10x every hour while awake until she sees the surgeon.  11.  How are your vitamins and calcium going?  How are you taking them? Pt states that she is taking her supplements and vitamins without difficulty.  Reminded patient that the first 30 days post-operatively are important for successful recovery.  Practice good hand hygiene, wearing  a mask when appropriate (since optional in most places), and minimizing exposure to people who live outside of the home, especially if they are exhibiting any respiratory, GI, or illness-like symptoms.

## 2022-01-30 ENCOUNTER — Telehealth (HOSPITAL_COMMUNITY): Payer: Self-pay | Admitting: *Deleted

## 2022-01-30 NOTE — Telephone Encounter (Signed)
Pt's daughter called and stated that Ms. Spatz had taken her bariatric MVI around 7am and began drinking her protein shake. When she went to the bathroom, she began having extreme pain that was "coming and going" and became nauseated and vomited x 1.  Daughter inquired about taking mother to ED, but I suggested to try some nausea/ pain medication first and give it some time before drinking again.  I also suggested for pt to try protein powder added to liquid instead of protein shake today.  Pain may be from drinking too fast or the combination with the MVI. Also reinforced that pantoprazole is a daily medication and not prn, pt has not taken since discharge.  If pain did not resolve, for pt to call office for further instructions.

## 2022-02-06 ENCOUNTER — Ambulatory Visit: Payer: No Typology Code available for payment source

## 2022-02-09 ENCOUNTER — Encounter: Payer: Self-pay | Admitting: Dietician

## 2022-02-09 ENCOUNTER — Encounter: Payer: No Typology Code available for payment source | Attending: Surgery | Admitting: Dietician

## 2022-02-09 VITALS — Ht 62.0 in | Wt 245.3 lb

## 2022-02-09 DIAGNOSIS — E669 Obesity, unspecified: Secondary | ICD-10-CM | POA: Insufficient documentation

## 2022-02-09 NOTE — Progress Notes (Signed)
2 Week Post-Operative Nutrition Class   Patient was seen on 02/09/2022 for Post-Operative Nutrition education at the [facility].    Surgery date: 01/23/2022 Surgery type: sleeve gastrectomy   Anthropometrics  Start weight at [facility]: 262.8 lbs (date: 10/13/2021)  Height: 62 in Weight today: 245.3 lbs. BMI: 44.87 kg/m2     Clinical  Medical hx: PTSD, ovarian cyst, uterine fibroids, endometriosis, knee arthritis, anxiety, hypertension, sleep apnea, obesity, migraines  Medications: Tylenol, albuterol, flexeril, motrin, trazodone, sertraline, vit D, amlodipine, cholecalciferol, naproxen, Celebrex Labs: RBC 5.11 Notable signs/symptoms: none noted Any previous deficiencies? No Bowel Habits: Every day to every other day no complaints   Body Composition Scale 02/09/2022  Current Body Weight 245.3  Total Body Fat % 47.8  Visceral Fat 20  Fat-Free Mass % 52.1   Total Body Water % 40.5  Muscle-Mass lbs 28.1  BMI 44.7  Body Fat Displacement          Torso  lbs 72.7         Left Leg  lbs 14.5         Right Leg  lbs 14.5         Left Arm  lbs 7.2         Right Arm   lbs 7.2      The following the learning objectives were met by the patient during this course: Identifies Phase 3 (Soft, High Proteins) Dietary Goals and will begin from 2 weeks post-operatively to 2 months post-operatively Identifies appropriate sources of fluids and proteins  Identifies appropriate fat sources and healthy verses unhealthy fat types   States protein recommendations and appropriate sources post-operatively Identifies the need for appropriate texture modifications, mastication, and bite sizes when consuming solids Identifies appropriate fat consumption and sources Identifies appropriate multivitamin and calcium sources post-operatively Describes the need for physical activity post-operatively and will follow MD recommendations States when to call healthcare provider regarding  medication questions or post-operative complications   Handouts given during class include: Phase 3A: Soft, High Protein Diet Handout Phase 3 High Protein Meals Healthy Fats   Follow-Up Plan: Patient will follow-up at [facility] in 6 weeks for 2 month post-op nutrition visit for diet advancement per MD.

## 2022-02-13 ENCOUNTER — Telehealth: Payer: Self-pay | Admitting: Dietician

## 2022-02-13 NOTE — Telephone Encounter (Signed)
RD called pt to verify fluid intake once starting soft, solid proteins 2 week post-bariatric surgery.   Daily Fluid intake: 32-48 Daily Protein intake: 60 grams Bowel Habits: no issues  Concerns/issues:  Pt states when food hits her stomach it hurts, stating it hurts for a little while and then goes away, stating like a cramp.  Pt trying to increase food intake and decrease supplementation with shakes.  Pt optimistic.

## 2022-03-22 ENCOUNTER — Encounter: Payer: Self-pay | Admitting: Dietician

## 2022-03-22 ENCOUNTER — Encounter: Payer: No Typology Code available for payment source | Attending: Surgery | Admitting: Dietician

## 2022-03-22 VITALS — Ht 62.0 in | Wt 230.8 lb

## 2022-03-22 DIAGNOSIS — E669 Obesity, unspecified: Secondary | ICD-10-CM | POA: Diagnosis not present

## 2022-03-22 NOTE — Progress Notes (Signed)
Bariatric Nutrition Follow-Up Visit Medical Nutrition Therapy  Appt Start Time: 10:25   End Time: 10:53  Surgery date: 01/23/2022 Surgery type: sleeve gastrectomy   NUTRITION ASSESSMENT  Anthropometrics  Start weight at [facility]: 262.8 lbs (date: 10/13/2021)  Height: 62 in Weight today: 230.8 lbs. BMI: 42.21 kg/m2     Clinical  Medical hx: PTSD, ovarian cyst, uterine fibroids, endometriosis, knee arthritis, anxiety, hypertension, sleep apnea, obesity, migraines  Medications: Tylenol, albuterol, flexeril, motrin, trazodone, sertraline, vit D, amlodipine, cholecalciferol, naproxen, Celebrex Labs: RBC 5.11 Notable signs/symptoms: none noted Any previous deficiencies? No Bowel Habits: Every day to every other day no complaints   Body Composition Scale 02/09/2022 03/22/2022  Current Body Weight 245.3 230.8  Total Body Fat % 47.8 46.5  Visceral Fat 20 19  Fat-Free Mass % 52.1 53.4   Total Body Water % 40.5 41.2  Muscle-Mass lbs 28.1 27.9  BMI 44.7 42.1  Body Fat Displacement           Torso  lbs 72.7 66.5         Left Leg  lbs 14.5 13.3         Right Leg  lbs 14.5 13.3         Left Arm  lbs 7.2 6.6         Right Arm   lbs 7.2 6.6     Lifestyle & Dietary Hx  Pt states she is beginning her part time job full time now, stating it will be busy for her.  Pt states she is concentrating on meal prep. Pt states she tried the stationary bike, but it hurt her knees.  Pt states she can continue to walk. Pt states she had sweet tea and agrees she shouldn't have.  We reviewed the option of drinking un-sweet tea and add sugar substitute as desired.  Estimated daily fluid intake: 64 oz Estimated daily protein intake: 60 g Supplements: multivitamin and calcium Current average weekly physical activity: walking, parking further away, getting up from her desk during the day. Walk around the house, two or 3 times a day  24-Hr Dietary Recall First Meal: yogurt or sausage and egg Snack: nuts and  cheese  Second Meal: chicken and cheese at [employer]: nuts and cheese  Third Meal: chicken or ham or fish/shrimp Snack:  Beverages: water, sweet tea  Post-Op Goals/ Signs/ Symptoms Using straws: no Drinking while eating: no Chewing/swallowing difficulties: no Changes in vision: no Changes to mood/headaches: no Hair loss/changes to skin/nails: no Difficulty focusing/concentrating: no Sweating: no Limb weakness: no Dizziness/lightheadedness: no Palpitations: no  Carbonated/caffeinated beverages: no N/V/D/C/Gas: no Abdominal pain: no Dumping syndrome: no    NUTRITION DIAGNOSIS  Overweight/obesity ([place]-3.3) related to past poor dietary habits and physical inactivity as evidenced by completed bariatric surgery and following dietary guidelines for continued weight loss and healthy nutrition status.     NUTRITION INTERVENTION Nutrition counseling (C-1) and education (E-2) to facilitate bariatric surgery goals, including: Diet advancement to the next phase (phase 4) now including non-starchy The importance of consuming adequate calories as well as certain nutrients daily due to the body's need for essential vitamins, minerals, and fats The importance of daily physical activity and to reach a goal of at least 150 minutes of moderate to vigorous physical activity weekly (or as directed by their physician) due to benefits such as increased musculature and improved lab values The importance of intuitive eating specifically learning hunger-satiety cues and understanding the importance of learning a new body: The  importance of mindful eating to avoid grazing behaviors   Handouts Provided Include  Phase 4 Food Plan  Learning Style & Readiness for Change Teaching method utilized: Visual & Auditory  Demonstrated degree of understanding via: Teach Back  Readiness Level: preparation Barriers to learning/adherence to lifestyle change: nothing identified  RD's Notes for Next  Visit Assess adherence to pt chosen goals  MONITORING & EVALUATION Dietary intake, weekly physical activity, body weight.  Next Steps Patient is to follow-up in 4 months for 6 month post-op follow-up.

## 2022-07-10 ENCOUNTER — Encounter: Payer: No Typology Code available for payment source | Attending: Surgery | Admitting: Skilled Nursing Facility1

## 2022-07-11 ENCOUNTER — Encounter: Payer: Self-pay | Admitting: Skilled Nursing Facility1

## 2022-07-11 NOTE — Progress Notes (Signed)
Follow-up visit:  Post-Operative sleeve Surgery  Medical Nutrition Therapy:  Appt start time: 6:00pm end time:  7:00pm  Primary concerns today: Post-operative Bariatric Surgery Nutrition Management 6 Month Post-Op Class  Surgery date: 01/23/2022 Surgery type: sleeve gastrectomy   NUTRITION ASSESSMENT  Anthropometrics  Start weight at [facility]: 262.8 lbs (date: 10/13/2021)  Height: 62 in Weight today: 207 pounds   Clinical  Medical hx: PTSD, ovarian cyst, uterine fibroids, endometriosis, knee arthritis, anxiety, hypertension, sleep apnea, obesity, migraines  Medications: Tylenol, albuterol, flexeril, motrin, trazodone, sertraline, vit D, amlodipine, cholecalciferol, naproxen, Celebrex Labs: RBC 5.11 Notable signs/symptoms: none noted Any previous deficiencies? No Bowel Habits: Every day to every other day no complaints   Body Composition Scale 02/09/2022 03/22/2022 07/11/2022  Current Body Weight 245.3 230.8 207  Total Body Fat % 47.8 46.5 43  Visceral Fat 20 19 15   Fat-Free Mass % 52.1 53.4 56.9   Total Body Water % 40.5 41.2 42.9  Muscle-Mass lbs 28.1 27.9 28.6  BMI 44.7 42.1 36.3  Body Fat Displacement            Torso  lbs 72.7 66.5 55.1         Left Leg  lbs 14.5 13.3 11         Right Leg  lbs 14.5 13.3 11         Left Arm  lbs 7.2 6.6 5.5         Right Arm   lbs 7.2 6.6 5.5     Information Reviewed/ Discussed During Appointment: -Review of composition scale numbers -Fluid requirements (64-100 ounces) -Protein requirements (60-80g) -Strategies for tolerating diet -Advancement of diet to include Starchy vegetables -Barriers to inclusion of new foods -Inclusion of appropriate multivitamin and calcium supplements  -Exercise recommendations   Fluid intake: adequate   Medications: See List Supplementation: appropriate    Using straws: no Drinking while eating: no Having you been chewing well: yes Chewing/swallowing difficulties: no Changes in vision:  no Changes to mood/headaches: no Hair loss/Cahnges to skin/Changes to nails: no Any difficulty focusing or concentrating: no Sweating: no Dizziness/Lightheaded: no Palpitations: no  Carbonated beverages: no N/V/D/C/GAS: no Abdominal Pain: no Dumping syndrome: no  Recent physical activity:  ADL's  Progress Towards Goal(s):  In Progress Teaching method utilized: Visual & Auditory  Demonstrated degree of understanding via: Teach Back  Readiness Level: Action Barriers to learning/adherence to lifestyle change: none identified  Handouts given during visit include: Phase V diet Progression  Goals Sheet The Benefits of Exercise are endless..... Support Group Topics   Teaching Method Utilized:  Visual Auditory Hands on  Demonstrated degree of understanding via:  Teach Back   Monitoring/Evaluation:  Dietary intake, exercise, and body weight. Follow up in 3 months for 9 month post-op visit.

## 2022-10-16 ENCOUNTER — Encounter: Payer: No Typology Code available for payment source | Attending: Surgery | Admitting: Skilled Nursing Facility1

## 2022-10-16 ENCOUNTER — Encounter: Payer: Self-pay | Admitting: Skilled Nursing Facility1

## 2022-10-16 NOTE — Progress Notes (Signed)
Follow-up visit:  Post-Operative sleeve Surgery  Medical Nutrition Therapy  Primary concerns today: Post-operative Bariatric Surgery Nutrition Management   Surgery date: 01/23/2022 Surgery type: sleeve gastrectomy   NUTRITION ASSESSMENT  Anthropometrics  Start weight at [facility]: 262.8 lbs (date: 10/13/2021)  Height: 62 in Weight today: 188.1 pounds   Clinical  Medical hx: PTSD, ovarian cyst, uterine fibroids, endometriosis, knee arthritis, anxiety, hypertension, sleep apnea, obesity, migraines  Medications: Tylenol, albuterol, flexeril, motrin, trazodone, sertraline, vit D, amlodipine, cholecalciferol, naproxen, Celebrex Labs:  WNL Notable signs/symptoms: none noted Any previous deficiencies? No Bowel Habits: Every day to every other day no complaints   Body Composition Scale 02/09/2022 03/22/2022 07/11/2022 10/16/2022  Current Body Weight 245.3 230.8 207 188.1  Total Body Fat % 47.8 46.5 43 40.5  Visceral Fat 20 19 15 13   Fat-Free Mass % 52.1 53.4 56.9 59.4   Total Body Water % 40.5 41.2 42.9 44.2  Muscle-Mass lbs 28.1 27.9 28.6 28.3  BMI 44.7 42.1 36.3 32.9  Body Fat Displacement             Torso  lbs 72.7 66.5 55.1 47.1         Left Leg  lbs 14.5 13.3 11 9.4         Right Leg  lbs 14.5 13.3 11 9.4         Left Arm  lbs 7.2 6.6 5.5 4.7         Right Arm   lbs 7.2 6.6 5.5 4.7   Pt states she wants to be 150 pounds. Pt states she just had knee surgery sating it is healing really well. Pt states she has a gym and chair workouts; Pt states she works out of [employer]. 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    24 hr recall: makes a fruit bowl at the beginning of the week  B: bacon and wheat toast and egg S: [employer] [place] granola bar S: fruit  L: eaten out with her friend: shrimp or salmon with broccoli S: ice cream  D: chicken and vegetables  Beverages: water  Fluid intake: adequate   Medications: See List Supplementation: plans to restart  Using  straws: no Drinking while eating: no Having you been chewing well: yes Chewing/swallowing difficulties: no Changes in vision: no Changes to mood/headaches: no Hair loss/Cahnges to skin/Changes to nails: no Any difficulty focusing or concentrating: no Sweating: no Dizziness/Lightheaded: no Palpitations: no  Carbonated beverages: no N/V/D/C/GAS: some constipation with her medication she states Abdominal Pain: no Dumping syndrome: no  Recent physical activity:  walking in her hallway in the house and walking to the mailbox  Progress Towards Goal(s):  In Progress Teaching method utilized: Patent attorney & Auditory  Demonstrated degree of understanding via: Teach Back  Readiness Level: Action Barriers to learning/adherence to lifestyle change: none identified  Teaching Method Utilized:  Visual Auditory Hands on  Demonstrated degree of understanding via:  Teach Back   Monitoring/Evaluation:  Dietary intake, exercise, and body weight.

## 2023-01-24 ENCOUNTER — Telehealth: Payer: Self-pay

## 2023-01-24 ENCOUNTER — Ambulatory Visit: Payer: No Typology Code available for payment source | Admitting: Dietician

## 2023-01-24 NOTE — Telephone Encounter (Signed)
Reminder letter mailed to the patient.

## 2023-01-29 ENCOUNTER — Encounter: Payer: Self-pay | Admitting: Dietician

## 2023-01-29 ENCOUNTER — Encounter: Payer: No Typology Code available for payment source | Attending: Surgery | Admitting: Dietician

## 2023-01-29 VITALS — Ht 63.0 in | Wt 179.5 lb

## 2023-01-29 DIAGNOSIS — E669 Obesity, unspecified: Secondary | ICD-10-CM | POA: Diagnosis present

## 2023-01-29 NOTE — Progress Notes (Signed)
Follow-up visit:  Post-Operative sleeve Surgery  Medical Nutrition Therapy  Primary concerns today: Post-operative Bariatric Surgery Nutrition Management   Surgery date: 01/23/2022 Surgery type: sleeve gastrectomy   NUTRITION ASSESSMENT  Anthropometrics  Start weight at [facility]: 262.8 lbs (date: 10/13/2021)  Height: 62 in Weight today: 179.5 pounds  Clinical  Medical hx: PTSD, ovarian cyst, uterine fibroids, endometriosis, knee arthritis, anxiety, hypertension, sleep apnea, obesity, migraines  Medications: Tylenol, albuterol, flexeril, motrin, trazodone, sertraline, vit D, amlodipine, cholecalciferol, naproxen, Celebrex Labs: Sodium 148; chloride 111; albumin 3.8; ferritin 201 Notable signs/symptoms: none noted Any previous deficiencies? No Bowel Habits: Every day to every other day no complaints   Body Composition Scale 02/09/2022 03/22/2022 07/11/2022 10/16/2022 01/29/2023  Current Body Weight 245.3 230.8 207 188.1 179.5  Total Body Fat % 47.8 46.5 43 40.5 39.1  Visceral Fat 20 19 15 13 12   Fat-Free Mass % 52.1 53.4 56.9 59.4 60.8   Total Body Water % 40.5 41.2 42.9 44.2 44.9  Muscle-Mass lbs 28.1 27.9 28.6 28.3 28.2  BMI 44.7 42.1 36.3 32.9 31.4  Body Fat Displacement              Torso  lbs 72.7 66.5 55.1 47.1 43.4         Left Leg  lbs 14.5 13.3 11 9.4 8.6         Right Leg  lbs 14.5 13.3 11 9.4 8.6         Left Arm  lbs 7.2 6.6 5.5 4.7 4.3         Right Arm   lbs 7.2 6.6 5.5 4.7 4.3    Pt states she is seeing someone at the [place] [facility], stating she is not seeing the weight loss, stating even though she know she has lost weight. Pt states she has been walking, stating her mother has been sick and she has been back to [place] to help care for her. Pt states she has a consult to consider skin removal, stating her skin is flabby and she states she doesn't like to hear it flap. Pt was excited that she could sit down in the tub for a bath, due to her weight  loss.  24 hr recall: makes a fruit bowl at the beginning of the week  B: bacon and wheat toast and egg S: [employer] [place] granola bar S: fruit  L: eaten out with her friend: shrimp or salmon with broccoli S: ice cream  D: chicken and vegetables  Beverages: water  Fluid intake: adequate   Medications: See List Supplementation: plans to restart  Using straws: no Drinking while eating: no Having you been chewing well: yes Chewing/swallowing difficulties: no Changes in vision: no Changes to mood/headaches: no Hair loss/Cahnges to skin/Changes to nails: no Any difficulty focusing or concentrating: no Sweating: no Dizziness/Lightheaded: no Palpitations: no  Carbonated beverages: no N/V/D/C/GAS: taking dulcolax for constipation Abdominal Pain: no Dumping syndrome: no  Recent physical activity:  walking in her hallway in the house and walking to the mailbox. Pt states she has some weights at home, stating she might use them about once a week.  Goals Increase physical activity; walk [facility] Wednesday Friday; light weight at home Tuesday Thursday Saturday. Check out [employer] (with new times/hours)  Progress Towards Goal(s):  In Progress Teaching method utilized: Visual & Auditory  Demonstrated degree of understanding via: Teach Back  Readiness Level: Action Barriers to learning/adherence to lifestyle change: none identified  Teaching Method Utilized:  Visual Auditory Hands on  Demonstrated degree of understanding via:  Teach Back   Monitoring/Evaluation:  Dietary intake, exercise, and body weight.

## 2023-03-29 ENCOUNTER — Ambulatory Visit (INDEPENDENT_AMBULATORY_CARE_PROVIDER_SITE_OTHER): Payer: Self-pay | Admitting: Plastic Surgery

## 2023-03-29 VITALS — BP 143/85 | HR 77 | Ht 63.0 in | Wt 180.4 lb

## 2023-03-29 DIAGNOSIS — Z9884 Bariatric surgery status: Secondary | ICD-10-CM

## 2023-03-29 DIAGNOSIS — R21 Rash and other nonspecific skin eruption: Secondary | ICD-10-CM

## 2023-03-29 DIAGNOSIS — M793 Panniculitis, unspecified: Secondary | ICD-10-CM

## 2023-03-29 NOTE — Progress Notes (Signed)
Patient ID: Teresa Mann, female    DOB: 12/27/1960, 62 y.o.   MRN: [id number]   Chief Complaint  Patient presents with   Advice Only   Skin Problem    The patient is a 62 year old female here for evaluation of her abdomen.  The patient underwent a gastric sleeve in October 2023 with a hiatal hernia repair.  At the time of the surgery she was 5 feet 1 inch tall and 266 pounds.  She is now 180 pounds.  She also had a left knee replacement about 6 months ago.  She is doing well from that.  She is done really well with her weight loss but has a lot of skin breakdown and irritation due to the folds of the skin.  Her past medical history and surgical history is listed below.  She had an hemoglobin A1c in October and it was 6.1.  She is a Cytogeneticist and married to a veteran as well so does have the [place] benefits.  She has a good strong muscle wall as well.    Review of Systems  Constitutional: Negative.   HENT: Negative.    Eyes: Negative.   Respiratory: Negative.    Cardiovascular: Negative.   Gastrointestinal: Negative.   Endocrine: Negative.   Genitourinary: Negative.   Musculoskeletal: Negative.     Past Medical History:  Diagnosis Date   Arthritis of knee    Endometriosis    Fibroids    Headache    migraines   Heart murmur    Hypertension    Ovarian cyst    Pre-diabetes    PTSD (post-traumatic stress disorder)    Sleep apnea    Tuberculosis    false positive for TB,    Past Surgical History:  Procedure Laterality Date   DILATION AND CURETTAGE OF UTERUS     1990s for fibroids   LAPAROSCOPIC GASTRIC SLEEVE RESECTION N/A 01/22/2022   Procedure: LAPAROSCOPIC SLEEVE GASTRECTOMY;  Surgeon: Berna Bue, MD;  Location: [facility];  Service: General;  Laterality: N/A;   UPPER GI ENDOSCOPY N/A 01/22/2022   Procedure: UPPER GI ENDOSCOPY;  Surgeon: Berna Bue, MD;  Location: [facility];  Service: General;  Laterality: N/A;   WISDOM TOOTH EXTRACTION        Current  Outpatient Medications:    acetaminophen (TYLENOL) 500 MG tablet, Take 1,000 mg by mouth every 6 (six) hours as needed for mild pain., Disp: , Rfl:    albuterol (PROVENTIL HFA;VENTOLIN HFA) 108 (90 Base) MCG/ACT inhaler, Inhale 1-2 puffs into the lungs every 6 (six) hours as needed for wheezing or shortness of breath., Disp: 1 Inhaler, Rfl: 0   amLODipine (NORVASC) 5 MG tablet, Take 10 mg by mouth daily., Disp: , Rfl:    Cholecalciferol 50 MCG (2000 UT) TABS, Take 2,000 Units by mouth daily., Disp: , Rfl:    cyclobenzaprine (FLEXERIL) 10 MG tablet, Take 1 tablet (10 mg total) by mouth at bedtime. (Patient taking differently: Take 10 mg by mouth 3 (three) times daily as needed for muscle spasms.), Disp: 10 tablet, Rfl: 0   fluticasone (FLONASE) 50 MCG/ACT nasal spray, Place 1 spray into both nostrils daily as needed for congestion., Disp: , Rfl:    gabapentin (NEURONTIN) 100 MG capsule, Take 2 capsules (200 mg total) by mouth every 12 (twelve) hours., Disp: 20 capsule, Rfl: 0   loratadine (CLARITIN) 10 MG tablet, Take 10 mg by mouth daily as needed for allergies., Disp: , Rfl:  ondansetron (ZOFRAN-ODT) 4 MG disintegrating tablet, Take 1 tablet (4 mg total) by mouth every 6 (six) hours as needed for nausea or vomiting., Disp: 20 tablet, Rfl: 0   OVER THE COUNTER MEDICATION, Apply 1 Application topically daily as needed (pain). Pain Cream, Disp: , Rfl:    pantoprazole (PROTONIX) 40 MG tablet, Take 1 tablet (40 mg total) by mouth daily., Disp: 90 tablet, Rfl: 0   sertraline (ZOLOFT) 50 MG tablet, Take 50 mg by mouth daily., Disp: , Rfl:    traMADol (ULTRAM) 50 MG tablet, Take 1 tablet (50 mg total) by mouth every 6 (six) hours as needed (pain)., Disp: 10 tablet, Rfl: 0   traZODone (DESYREL) 100 MG tablet, Take 50 mg by mouth at bedtime as needed for sleep., Disp: , Rfl:    Objective:   Vitals:   03/29/23 1120  BP: (!) 143/85  Pulse: 77  SpO2: 97%    Physical Exam Constitutional:       Appearance: Normal appearance.  Cardiovascular:     Rate and Rhythm: Normal rate.     Pulses: Normal pulses.  Pulmonary:     Effort: Pulmonary effort is normal.  Abdominal:     Palpations: Abdomen is soft.  Musculoskeletal:        General: No swelling or deformity.  Skin:    General: Skin is warm.     Capillary Refill: Capillary refill takes less than 2 seconds.  Neurological:     Mental Status: She is alert and oriented to person, place, and time.  Psychiatric:        Mood and Affect: Mood normal.        Behavior: Behavior normal.        Thought Content: Thought content normal.        Judgment: Judgment normal.     Assessment & Plan:  Panniculitis  The patient is a very good candidate for a panniculectomy.  She does plan on losing between 20 and 40 pounds.  At this point I would definitely say she should wait until she is closer to her ideal before she has the surgery.  That way she will have a better long-term result.  The patient is in agreement.  Will plan to see her back in about 5 months.  Pictures were obtained of the patient and placed in the chart with the patient's or guardian's permission.   Teresa Bills Talayia Hjort, DO

## 2023-05-09 ENCOUNTER — Encounter: Payer: No Typology Code available for payment source | Attending: Surgery | Admitting: Dietician

## 2023-05-09 ENCOUNTER — Encounter: Payer: Self-pay | Admitting: Dietician

## 2023-05-09 DIAGNOSIS — E669 Obesity, unspecified: Secondary | ICD-10-CM | POA: Diagnosis present

## 2023-05-09 NOTE — Progress Notes (Signed)
 Follow-up visit:  Post-Operative sleeve Surgery  Medical Nutrition Therapy  Primary concerns today: Post-operative Bariatric Surgery Nutrition Management   Surgery date: 01/23/2022 Surgery type: sleeve gastrectomy   NUTRITION ASSESSMENT  Anthropometrics  Start weight at [facility]: 262.8 lbs (date: 10/13/2021)  Height: 62 in Weight today: 179.5 pounds  Clinical  Medical hx: PTSD, ovarian cyst, uterine fibroids, endometriosis, knee arthritis, anxiety, hypertension, sleep apnea, obesity, migraines  Medications: see list Labs: Sodium 148; chloride 111; albumin 3.8; ferritin 201 Notable signs/symptoms: none noted Any previous deficiencies? No Bowel Habits: Every day to every other day no complaints   Body Composition Scale 02/09/2022 03/22/2022 07/11/2022 10/16/2022 01/29/2023 05/09/2023  Current Body Weight 245.3 230.8 207 188.1 179.5 178.6  Total Body Fat % 47.8 46.5 43 40.5 39.1 39.0  Visceral Fat 20 19 15 13 12 11   Fat-Free Mass % 52.1 53.4 56.9 59.4 60.8 60.9   Total Body Water % 40.5 41.2 42.9 44.2 44.9 44.9  Muscle-Mass lbs 28.1 27.9 28.6 28.3 28.2 28.2  BMI 44.7 42.1 36.3 32.9 31.4 31.2  Body Fat Displacement               Torso  lbs 72.7 66.5 55.1 47.1 43.4 43.0         Left Leg  lbs 14.5 13.3 11 9.4 8.6 8.6         Right Leg  lbs 14.5 13.3 11 9.4 8.6 8.6         Left Arm  lbs 7.2 6.6 5.5 4.7 4.3 4.3         Right Arm   lbs 7.2 6.6 5.5 4.7 4.3 4.3   Lifestyle & Dietary Hx  Pt states she has been eating M&Ms, stating she is trying to mix them with nuts to increase protein. Pt states she wants to get into more physical activity. Pt states she would like to check out the BELT program. Pt states she is considering surgery for excess skin. Pt took notes with current goals in mind.  24 hr recall:  B: coffee, sausage egg and cheese mc muffin without the bread (without the english muffin) S: trail mix (homemade) L: salad or boiled fish or taco bowl S: pork rinds D:  salad  Beverages: water, lemonade watered down  Fluid intake: 64 oz Supplementation: multivitamin and calcium  Recent physical activity:  walking in her hallway in the house and walking to the mailbox. Pt states she has some weights at home, stating she might use them about once a week.  NUTRITION DIAGNOSIS  Overweight/obesity ([place]-3.3) related to past poor dietary habits and physical inactivity as evidenced by completed bariatric surgery and following dietary guidelines for continued weight loss and healthy nutrition status.   NUTRITION INTERVENTION Nutrition counseling (C-1) and education (E-2) to facilitate bariatric surgery goals, including:  Why you need complex carbohydrates: Whole grains and other complex carbohydrates are required to have a healthy diet. Whole grains provide fiber which can help with blood glucose levels and help keep you satiated. Fruits and starchy vegetables provide essential vitamins and minerals required for immune function, eyesight support, brain support, bone density, wound healing and many other functions within the body. According to the current evidenced based 2020-2025 Dietary Guidelines for Americans, complex carbohydrates are part of a healthy eating pattern which is associated with a decreased risk for type 2 diabetes, cancers, and cardiovascular disease.  Encouraged patient to honor their body's internal hunger and fullness cues.  Throughout the day, check in mentally and rate  hunger. Stop eating when satisfied not full regardless of how much food is left on the plate.  Get more if still hungry 20-30 minutes later.  The key is to honor satisfaction so throughout the meal, rate fullness factor and stop when comfortably satisfied not physically full. The key is to honor hunger and fullness without any feelings of guilt or shame.  Pay attention to what the internal cues are, rather than any external factors. This will enhance the confidence you have in listening  to your own body and following those internal cues enabling you to increase how often you eat when you are hungry not out of appetite and stop when you are satisfied not full.  Encouraged pt to continue to eat balanced meals inclusive of non starchy vegetables 2 times a day 7 days a week Encouraged pt to choose lean protein sources: limiting beef, pork, sausage, hotdogs, and lunch meat Encourage pt to choose healthy fats such as plant based limiting animal fats Encouraged pt to continue to drink a minium 64 fluid ounces with half being plain water to satisfy proper hydration    Handouts Provided Include  Body Comp printout  Goals Increase physical activity; walk at [facility] Monday Wednesday Friday; light weight at home Tuesday Thursday Saturday. Re-visit/engage: Check out BEL[facility] (with new times/hours) New: aim for mostly plain water; avoid sugar sweetened beverages  Progress Towards Goal(s):  In Progress Teaching method utilized: Patent Attorney & Auditory  Demonstrated degree of understanding via: Teach Back  Readiness Level: Action Barriers to learning/adherence to lifestyle change: none identified  Teaching Method Utilized:  Visual Auditory Hands on  Demonstrated degree of understanding via:  Teach Back   Monitoring/Evaluation:  Dietary intake, exercise, and body weight.

## 2023-08-13 ENCOUNTER — Encounter: Payer: Self-pay | Attending: Surgery | Admitting: Skilled Nursing Facility1

## 2023-08-13 VITALS — Wt 195.8 lb

## 2023-08-13 DIAGNOSIS — E669 Obesity, unspecified: Secondary | ICD-10-CM | POA: Insufficient documentation

## 2023-08-15 ENCOUNTER — Encounter: Payer: Self-pay | Admitting: Skilled Nursing Facility1

## 2023-08-15 NOTE — Progress Notes (Signed)
 Follow-up visit:  Post-Operative sleeve Surgery  Medical Nutrition Therapy:  Appt start time: 6:00pm end time:  7:00pm  Primary concerns today: Post-operative Bariatric Surgery Nutrition Management 6 Month Post-Op Class  Surgery date: 01/23/2022 Surgery type: sleeve gastrectomy   NUTRITION ASSESSMENT  Anthropometrics  Start weight at [facility]: 262.8 lbs (date: 10/13/2021)  Height: 62 in Weight today: 179.5 pounds  Clinical  Medical hx: PTSD, ovarian cyst, uterine fibroids, endometriosis, knee arthritis, anxiety, hypertension, sleep apnea, obesity, migraines  Medications: see list Labs: Sodium 148; chloride 111; albumin 3.8; ferritin 201 Notable signs/symptoms: none noted Any previous deficiencies? No Bowel Habits: Every day to every other day no complaints   Body Composition Scale 03/22/2022 07/11/2022 10/16/2022 01/29/2023 05/09/2023   Current Body Weight 230.8 207 188.1 179.5 178.6 195.8  Total Body Fat % 46.5 43 40.5 39.1 39.0 41.6  Visceral Fat 19 15 13 12 11 14   Fat-Free Mass % 53.4 56.9 59.4 60.8 60.9 58.3   Total Body Water % 41.2 42.9 44.2 44.9 44.9 43.6  Muscle-Mass lbs 27.9 28.6 28.3 28.2 28.2 28.3  BMI 42.1 36.3 32.9 31.4 31.2 34.3  Body Fat Displacement               Torso  lbs 66.5 55.1 47.1 43.4 43.0 50.4         Left Leg  lbs 13.3 11 9.4 8.6 8.6 10         Right Leg  lbs 13.3 11 9.4 8.6 8.6 10         Left Arm  lbs 6.6 5.5 4.7 4.3 4.3 5         Right Arm   lbs 6.6 5.5 4.7 4.3 4.3 5     Information Reviewed/ Discussed During Appointment: -Review of composition scale numbers -Fluid requirements (64-100 ounces) -Protein requirements (60-80g) -Strategies for tolerating diet -Advancement of diet to include Starchy vegetables -Barriers to inclusion of new foods -Inclusion of appropriate multivitamin and calcium supplements  -Exercise recommendations   Fluid intake: adequate   Medications: See List Supplementation: appropriate   Using straws: no Drinking  while eating: no Having you been chewing well: yes Chewing/swallowing difficulties: no Changes in vision: no Changes to mood/headaches: no Hair loss/Cahnges to skin/Changes to nails: no Any difficulty focusing or concentrating: no Sweating: no Dizziness/Lightheaded: no Palpitations: no  Carbonated beverages: no N/V/D/C/GAS: no Abdominal Pain: no Dumping syndrome: no  Recent physical activity:  ADL's  Progress Towards Goal(s):  In Progress Teaching method utilized: Visual & Auditory  Demonstrated degree of understanding via: Teach Back  Readiness Level: Action Barriers to learning/adherence to lifestyle change: none identified  Handouts given during visit include: Phase V diet Progression  Goals Sheet The Benefits of Exercise are endless..... Support Group Topics    Teaching Method Utilized:  Visual Auditory Hands on  Demonstrated degree of understanding via:  Teach Back   Monitoring/Evaluation:  Dietary intake, exercise, and body weight. Follow up in 3 months for 9 month post-op visit.

## 2023-08-27 ENCOUNTER — Ambulatory Visit (INDEPENDENT_AMBULATORY_CARE_PROVIDER_SITE_OTHER): Payer: Self-pay | Admitting: Plastic Surgery

## 2023-08-27 ENCOUNTER — Encounter: Payer: Self-pay | Admitting: Plastic Surgery

## 2023-08-27 VITALS — BP 153/90 | HR 66 | Ht 63.0 in | Wt 197.6 lb

## 2023-08-27 DIAGNOSIS — M546 Pain in thoracic spine: Secondary | ICD-10-CM

## 2023-08-27 DIAGNOSIS — M549 Dorsalgia, unspecified: Secondary | ICD-10-CM | POA: Insufficient documentation

## 2023-08-27 DIAGNOSIS — Z9884 Bariatric surgery status: Secondary | ICD-10-CM

## 2023-08-27 DIAGNOSIS — G8929 Other chronic pain: Secondary | ICD-10-CM

## 2023-08-27 DIAGNOSIS — M793 Panniculitis, unspecified: Secondary | ICD-10-CM

## 2023-08-27 NOTE — Addendum Note (Signed)
 Addended by: Mabeline Savant on: 08/27/2023 10:26 AM   Modules accepted: Orders

## 2023-08-27 NOTE — Progress Notes (Signed)
   Subjective:    Patient ID: Teresa Mann, female    DOB: [birth year], 63 y.o.   MRN: [id number]  The patient is a 63 year old female here for further evaluation of her abdomen.  She had a gastric sleeve in October 2023 with a hiatal hernia repair.  She has been diligent about weight loss.  She complains of skin breakdown and rashes in her skin folds around the abdomen.  Over the past year the patient is actually gained weight so she is now 197 pounds.  She has been less active than she had been previously.  She is 5 feet 3 inches tall.  She has bought a treadmill and is going to commit to using it daily.  Her goal is to get back to 180.  She still has skin breakdown and back pain that she is hoping will be improved with panniculectomy.      Review of Systems  Constitutional: Negative.   HENT: Negative.    Eyes: Negative.   Respiratory: Negative.    Cardiovascular: Negative.   Gastrointestinal: Negative.   Endocrine: Negative.   Genitourinary: Negative.   Musculoskeletal: Negative.        Objective:   Physical Exam Constitutional:      Appearance: Normal appearance.  HENT:     Head: Atraumatic.  Cardiovascular:     Rate and Rhythm: Normal rate.     Pulses: Normal pulses.  Pulmonary:     Effort: Pulmonary effort is normal.  Abdominal:     Palpations: Abdomen is soft.  Skin:    General: Skin is warm.     Capillary Refill: Capillary refill takes less than 2 seconds.  Neurological:     Mental Status: She is alert and oriented to person, place, and time.  Psychiatric:        Mood and Affect: Mood normal.        Behavior: Behavior normal.        Thought Content: Thought content normal.        Judgment: Judgment normal.       Assessment & Plan:     ICD-10-CM   1. Panniculitis  M79.3     2. Chronic bilateral thoracic back pain  M54.6    G89.29       We will make a referral to the healthy weight and wellness center to see if that will help.  Additionally will plan to  see her back in 3 months and see if she can get back to the weight of 180 in order to have a better result after the surgery.  Pictures were obtained of the patient and placed in the chart with the patient's or guardian's permission.

## 2023-09-30 ENCOUNTER — Encounter (INDEPENDENT_AMBULATORY_CARE_PROVIDER_SITE_OTHER): Payer: Self-pay

## 2023-11-14 ENCOUNTER — Encounter: Payer: Self-pay | Admitting: Dietician

## 2023-11-14 ENCOUNTER — Encounter: Payer: Self-pay | Attending: Surgery | Admitting: Dietician

## 2023-11-14 DIAGNOSIS — E669 Obesity, unspecified: Secondary | ICD-10-CM | POA: Insufficient documentation

## 2023-11-14 NOTE — Progress Notes (Signed)
 Follow-up visit:  Post-Operative sleeve Surgery  Medical Nutrition Therapy  Primary concerns today: Post-operative Bariatric Surgery Nutrition Management  Surgery date: 01/23/2022 Surgery type: sleeve gastrectomy   NUTRITION ASSESSMENT  Anthropometrics  Start weight at [facility]: 262.8 lbs (date: 10/13/2021) Height: 62 in Weight today: 205.8 pounds  Clinical  Medical hx: PTSD, ovarian cyst, uterine fibroids, endometriosis, knee arthritis, anxiety, hypertension, sleep apnea, obesity, migraines  Medications: see EMR Labs: see EMR Notable signs/symptoms: none noted Any previous deficiencies? No Bowel Habits: Every day to every other day no complaints   Body Composition Scale 02/09/2022 03/22/2022 07/11/2022 10/16/2022 01/29/2023 05/09/2023 08/13/2023 11/14/2023  Current Body Weight 245.3 230.8 207 188.1 179.5 178.6 195.8 205.8  Total Body Fat % 47.8 46.5 43 40.5 39.1 39.0 41.6 43.1  Visceral Fat 20 19 15 13 12 11 14 15   Fat-Free Mass % 52.1 53.4 56.9 59.4 60.8 60.9 58.3 56.8   Total Body Water % 40.5 41.2 42.9 44.2 44.9 44.9 43.6 42.9  Muscle-Mass lbs 28.1 27.9 28.6 28.3 28.2 28.2 28.3 28.3  BMI 44.7 42.1 36.3 32.9 31.4 31.2 34.3 36.1  Body Fat Displacement                 Torso  lbs 72.7 66.5 55.1 47.1 43.4 43.0 50.4 55.0         Left Leg  lbs 14.5 13.3 11 9.4 8.6 8.6 10 11.0         Right Leg  lbs 14.5 13.3 11 9.4 8.6 8.6 10 11.0         Left Arm  lbs 7.2 6.6 5.5 4.7 4.3 4.3 5 5.5         Right Arm   lbs 7.2 6.6 5.5 4.7 4.3 4.3 5 5.5   Lifestyle & Dietary Hx  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 Pt states she can get depressed and eat things that don't meet her goals. Pt states her biggest craving is M&Ms. Pt states she needs to avoid the sugar sweetened beverages. Pt states she is considering retiring, stating she is stressed. Pt states she may give her two week notice on Saturday. Pt states she just got a treadmill (3-4 months), and and exercise bike (last  month). Pt states she has an appointment this month with a plastic surgeon to consider getting rid of excess skin.  24 hr recall:  B: coffee, sausage egg and cheese mc muffin sometimes without the bread (without the english muffin) S: trail mix (homemade) L: salad or boiled fish or taco bowl (from [employer]) S: fruit and nuts D: sandwich and water S:   Beverages: water, un-sweet tea with regular lemonade, coffee  Fluid intake: 64 oz Supplementation: multivitamin and calcium  Recent physical activity:  walking in her hallway in the house and walking to the mailbox. Pt states she has some weights at home, stating she might use them about once a week.  NUTRITION DIAGNOSIS  Overweight/obesity ([place]-3.3) related to past poor dietary habits and physical inactivity as evidenced by completed bariatric surgery and following dietary guidelines for continued weight loss and healthy nutrition status.   NUTRITION INTERVENTION Nutrition counseling (C-1) and education (E-2) to facilitate bariatric surgery goals, including:  Regular physical activity offers a wide range of health benefits that support both physical and mental well-being. It helps manage weight, strengthens muscles and bones, and reduces the risk of chronic diseases such as heart disease, type 2 diabetes, and certain cancers. Exercise also improves cardiovascular health,  lowers blood pressure, and enhances immune function. Mentally, it boosts mood, reduces symptoms of depression and anxiety, and improves sleep quality and cognitive function. For older adults, physical activity supports independence by improving balance and reducing the risk of falls. Even small amounts of movement throughout the day can lead to meaningful health improvements, making physical activity a vital part of a healthy lifestyle.  To support weight loss and overall health after bariatric surgery, it's important to avoid simple carbohydrates like white bread, sweets,  and sugary snacks. Instead, focus on complex carbohydrates such as whole grains, legumes, and non-starchy vegetables, which provide fiber, nutrients, and longer-lasting energy. These foods help regulate blood sugar and promote fullness. At the same time, aim for at least 60 grams of protein per day, with half your plate consisting of protein-rich foods like lean meats, eggs, tofu, or low-fat dairy. This balance supports muscle maintenance, healing, and satiety while helping you meet your nutritional goals.  Handouts Provided Include  Body Comp printout Bariatric MyPlate  Goals Re-engage: Check out BELT (with new times/hours) Re-engage: aim for mostly plain water; avoid sugar sweetened beverages New: focus on the Bariatric MyPlate for meal ideas and portion control  Progress Towards Goal(s):  In Progress Teaching method utilized: Patent attorney & Auditory  Demonstrated degree of understanding via: Teach Back  Readiness Level: Action Barriers to learning/adherence to lifestyle change: none identified  Teaching Method Utilized:  Visual Auditory Hands on  Demonstrated degree of understanding via:  Teach Back   Monitoring/Evaluation:  Dietary intake, exercise, and body weight.  Follow-Up Plan: Patient will follow-up at [facility] in 3 months.

## 2023-11-18 NOTE — Progress Notes (Deleted)
   Referring Provider Center, [facility] [address] [place],  [place] [address]   CC: No chief complaint on file.     Teresa Mann is an 63 y.o. female.  HPI: Patient is a 63 y.o. year old female here for follow up after attempted weight loss for consideration of panniculectomy  She was seen for initial consult by Dr. Lowery 08/27/2023.  At that time, she was s/p gastric sleeve 12/2021 and experiencing recurrent panniculitis.  Her target goal was 180 pounds and she had recently gained weight and was weighing 197 pounds.  Plan was for referral to healthy weight and wellness center and then follow-up after 3 months.  Goal is for 180 pounds in order to optimize results after surgery.  Pictures were placed in patient's chart.  Today,  Review of Systems General: Denies fevers MSK: Endorses ongoing back and neck discomfort Skin: Denies rashes ***  Physical Exam    08/27/2023   10:04 AM 08/15/2023    5:17 PM 03/29/2023   11:20 AM  Vitals with BMI  Height 5' 3  5' 3  Weight 197 lbs 10 oz 195 lbs 13 oz 180 lbs 6 oz  BMI 35.01  31.96  Systolic 153  143  Diastolic 90  85  Pulse 66  77    General:  No acute distress,  Alert and oriented, Non-Toxic, Normal speech and affect Psych: Normal behavior and mood Respiratory: No increased WOB MSK: Ambulatory  Assessment/Plan  Recurrent panniculitis in the context of extreme weight loss:  Patient is interested in pursuing surgical intervention via panniculectomy.  Discussed with patient we would submit to insurance for authorization, discussed approval could take up to 6 weeks.   Teresa Mann 11/18/2023, 2:49 PM

## 2023-11-19 ENCOUNTER — Ambulatory Visit: Admitting: Surgical

## 2023-11-19 ENCOUNTER — Ambulatory Visit: Admitting: Physician Assistant

## 2024-02-13 ENCOUNTER — Ambulatory Visit: Admitting: Dietician
